# Patient Record
Sex: Female | Born: 1965 | Race: White | Hispanic: Yes | Marital: Married | Smoking: Never smoker
Health system: Southern US, Community
[De-identification: ages and names within clinical notes are randomized; demographics above are authoritative.]

## PROBLEM LIST (undated history)

## (undated) DIAGNOSIS — R928 Other abnormal and inconclusive findings on diagnostic imaging of breast: Secondary | ICD-10-CM

---

## 2014-12-20 ENCOUNTER — Telehealth: Payer: Self-pay | Admitting: *Deleted

## 2014-12-20 ENCOUNTER — Ambulatory Visit: Payer: Self-pay | Attending: Physician Assistant

## 2014-12-20 DIAGNOSIS — M25572 Pain in left ankle and joints of left foot: Secondary | ICD-10-CM

## 2014-12-20 DIAGNOSIS — R262 Difficulty in walking, not elsewhere classified: Secondary | ICD-10-CM

## 2014-12-20 DIAGNOSIS — R609 Edema, unspecified: Secondary | ICD-10-CM

## 2014-12-20 NOTE — Therapy (Signed)
[place], [place], [address] Phone: [phone]   Fax:  [phone]  Physical Therapy Evaluation  Patient Details  Name: Tyianna Menefee MRN: [id number] Date of Birth: 19-Oct-1966  Encounter Date: 12/20/2014      PT End of Session - 12/20/14 0925    Visit Number 1   Number of Visits 12   Date for PT Re-Evaluation 01/29/15   PT Start Time 0845   PT Stop Time 0925   PT Time Calculation (min) 40 min   Activity Tolerance Patient tolerated treatment well   Behavior During Therapy [facility] for tasks assessed/performed      No past medical history on file.  No past surgical history on file.  There were no vitals taken for this visit.  Visit Diagnosis:  Edema  Pain in left ankle  Difficulty walking      Subjective Assessment - 12/20/14 0851    Symptoms Lt ankle pain, medical from anterior to heel cord insertion.    Pertinent History Spouse reports fell 6 months ago and has had pain since.    Limitations Standing;Walking;House hold activities   How long can you sit comfortably? As needed   How long can you stand comfortably? 5-6 hours for work but with pain   How long can you walk comfortably?  5-6 hours for work but with pain   Diagnostic tests Xrays : OA    Patient Stated Goals She wants her foot to heal up to wlk with less pan   Currently in Pain? No/denies   Pain Score 3    Pain Location Ankle   Pain Orientation Left   Pain Type Chronic pain   Pain Onset More than a month ago   Pain Frequency Constant   Aggravating Factors  Walking and standing   Pain Relieving Factors tylenol and rest   Effect of Pain on Daily Activities Limited on feet   Multiple Pain Sites No          [facility] PT Assessment - 12/20/14 0903    Assessment   Medical Diagnosis Lt ankle sprain, OA   Onset Date 05/20/14   Prior Therapy No   Precautions   Precautions None   Restrictions   Weight Bearing Restrictions No    Balance Screen   Has the patient fallen in the past 6 months No   Has the patient had a decrease in activity level because of a fear of falling?  No   Is the patient reluctant to leave their home because of a fear of falling?  No   Prior Function   Level of Independence Independent with basic ADLs   Observation/Other Assessments   Observations Circumference around ankles RT 24 cm  LT 28 cm.    AROM   Right Ankle Dorsiflexion 97   Right Ankle Plantar Flexion 45   Right Ankle Inversion 30   Right Ankle Eversion 22   Left Ankle Dorsiflexion 97   Left Ankle Plantar Flexion 30   Left Ankle Inversion 22   Left Ankle Eversion 32   PROM   Overall PROM  --  Equal to RT passively.    Ambulation/Gait   Ambulation/Gait --  She walks with decr weight to Lt foot. No device                          PT Education - 12/20/14 0925    Education provided Yes   Education  Details ice/elevation , shoe support   Person(s) Educated Patient;Spouse   Methods Explanation;Tactile cues  Done through daughter over phone   Comprehension Verbalized understanding          PT Short Term Goals - 12/20/14 0929    PT SHORT TERM GOAL #1   Title independent with inital HEP   Time 3   Period Weeks   Status New   PT SHORT TERM GOAL #2   Title She will report pain decreased 25% or more with activity on feet   Time 3   Period Weeks   Status New   PT SHORT TERM GOAL #3   Title decrease Edema to 25 Cm   Time 3   Period Weeks   Status New           PT Long Term Goals - 12/20/14 0930    PT LONG TERM GOAL #1   Title independent with HEP issued as of lst visit   Time 6   Period Weeks   Status New   PT LONG TERM GOAL #2   Title She will report 75% decr pain with walking ans standing at work and home.    Time 6   Period Weeks   Status New   PT LONG TERM GOAL #3   Title She will report able to stand and walk out of bed with miunimal pain   Time 6   Period Weeks   Status New                Plan - 12/20/14 4536    Clinical Impression Statement Pain limiting mobility and tolerance on feet.   Pt will benefit from skilled therapeutic intervention in order to improve on the following deficits Difficulty walking;Decreased range of motion;Pain;Increased edema;Decreased endurance   Rehab Potential Good   PT Frequency 2x / week   PT Duration 6 weeks   PT Treatment/Interventions Electrical Stimulation;Ultrasound;Cryotherapy;Manual techniques;Therapeutic exercise;Patient/family education;Passive range of motion;Contrast Bath   PT Next Visit Plan Band exercises , edema control, mobs and STW, taping,    PT Home Exercise Plan iceing , band exer, stretching   Recommended Other Services none   Consulted and Agree with Plan of Care Patient;Family member/caregiver   Family Member Consulted spouse         Problem List There are no active problems to display for this patient.   Darrel Hoover PT 12/20/2014, 10:16 AM  [facility] [address] [place], [place], [address] Phone: [phone]   Fax:  [phone]  Physician: Leonard Downing  Certification Start Date: 94/50/38  Certification End Date: 01/31/15  Physician Documentation Your signature is required to indicate approval of the treatment plan as stated above.  Please sign and either send electronically or make a copy of this report for your files and return this physician signed original.  Please mark one 1.__approve of plan   2. ___approve of plan with the followingconditions. ____________________________________________________________________________________________________________________________________________   ______________________                                                       _____________________ Physician Signature  Date    Faxed to MD for signature

## 2014-12-20 NOTE — Telephone Encounter (Signed)
appts made and printed...td 

## 2014-12-20 NOTE — Patient Instructions (Signed)
They were instructed through their daughter over phone to ice and elevate 2-3x/day as able and to look into new shoes for ankle and arch support.

## 2014-12-27 ENCOUNTER — Ambulatory Visit: Payer: Managed Care, Other (non HMO) | Attending: Physician Assistant | Admitting: Physical Therapy

## 2014-12-27 DIAGNOSIS — R609 Edema, unspecified: Secondary | ICD-10-CM

## 2014-12-27 DIAGNOSIS — M25572 Pain in left ankle and joints of left foot: Secondary | ICD-10-CM

## 2014-12-27 DIAGNOSIS — R262 Difficulty in walking, not elsewhere classified: Secondary | ICD-10-CM | POA: Insufficient documentation

## 2014-12-27 NOTE — Therapy (Signed)
[place], [place], [address] Phone: [phone]   Fax:  [phone]  Physical Therapy Treatment  Patient Details  Name: Morgan Wise MRN: [id number] Date of Birth: 10-13-1966  Encounter Date: 12/27/2014      PT End of Session - 12/27/14 1015    Visit Number 2   Number of Visits 12   Date for PT Re-Evaluation 01/29/15   PT Start Time 0945   PT Stop Time 1020   PT Time Calculation (min) 35 min   Activity Tolerance Patient limited by pain      No past medical history on file.  No past surgical history on file.  There were no vitals taken for this visit.  Visit Diagnosis:  Edema  Pain in left ankle  Difficulty walking      Subjective Assessment - 12/27/14 0953    Symptoms (p) Pain with walking.  3-4/ 10 medial ans at achilles, tender heel to palpation.   Pain Score (p) 4    Pain Location (p) Ankle   Pain Orientation (p) Left                    [facility] Adult PT Treatment/Exercise - 12/27/14 1005    Manual Therapy   Manual Therapy Edema management   friction massage at achilles, tender, taping, kinesiote   Ankle Exercises: Seated   Towel Inversion/Eversion Weights (lbs) --  10 reps band not tolerated   Other Seated Ankle Exercises --  ankle bands, red DF/PF, EV yellow for home                PT Education - 12/27/14 1306    Education provided Yes   Education Details ankle theraband   Person(s) Educated Patient;Child(ren)   Methods Explanation;Demonstration;Handout   Comprehension Verbalized understanding;Returned demonstration          PT Short Term Goals - 12/27/14 1311    PT SHORT TERM GOAL #1   Title independent with inital HEP   Time 3   Period Weeks   Status On-going   PT SHORT TERM GOAL #2   Title She will report pain decreased 25% or more with activity on feet   Time 3   Period Weeks   Status On-going   PT SHORT TERM GOAL #3   Title decrease  Edema to 25 Cm   Time 3   Period Weeks   Status Unable to assess           PT Long Term Goals - 12/20/14 0930    PT LONG TERM GOAL #1   Title independent with HEP issued as of lst visit   Time 6   Period Weeks   Status New   PT LONG TERM GOAL #2   Title She will report 75% decr pain with walking ans standing at work and home.    Time 6   Period Weeks   Status New   PT LONG TERM GOAL #3   Title She will report able to stand and walk out of bed with miunimal pain   Time 6   Period Weeks   Status New               Plan - 12/27/14 1309    Clinical Impression Statement walks with limp, needs better shoe support, able to build her home exercise program.   PT Next Visit Plan Band exercises review, edema control, mobs and STW, taping,  Problem List There are no active problems to display for this patient. Melvenia Needles, PTA 12/27/2014 1:12 PM Phone: [phone] Fax: [phone]   Melvenia Needles 12/27/2014, 1:12 PM  [facility] [address] [place], [place], [address] Phone: [phone]   Fax:  [phone]

## 2014-12-27 NOTE — Patient Instructions (Addendum)
Avoid walking barefoot.

## 2014-12-29 ENCOUNTER — Telehealth: Payer: Self-pay | Admitting: *Deleted

## 2014-12-29 ENCOUNTER — Ambulatory Visit: Payer: Managed Care, Other (non HMO)

## 2014-12-29 DIAGNOSIS — R609 Edema, unspecified: Secondary | ICD-10-CM

## 2014-12-29 DIAGNOSIS — R262 Difficulty in walking, not elsewhere classified: Secondary | ICD-10-CM

## 2014-12-29 DIAGNOSIS — M25572 Pain in left ankle and joints of left foot: Secondary | ICD-10-CM

## 2014-12-29 NOTE — Telephone Encounter (Signed)
APPTS MADE AND PRINTED...TD 

## 2014-12-29 NOTE — Patient Instructions (Addendum)
Answered questions about activity and pain.  Added single leg PF and balance for HEP and ball toss with family for balance. Done daily, 10-15 reps  Alll communication done with interpreter

## 2014-12-29 NOTE — Therapy (Signed)
[place], [place], [address] Phone: [phone]   Fax:  [phone]  Physical Therapy Treatment  Patient Details  Name: Morgan Wise MRN: [id number] Date of Birth: May 13, 1966  Encounter Date: 12/29/2014      PT End of Session - 12/29/14 1136    Visit Number 3   Number of Visits 12   Date for PT Re-Evaluation 01/29/15   PT Start Time 0932   PT Stop Time 1015   PT Time Calculation (min) 43 min   Activity Tolerance Patient tolerated treatment well  no pain post session      No past medical history on file.  No past surgical history on file.  There were no vitals taken for this visit.  Visit Diagnosis:  Edema  Pain in left ankle  Difficulty walking      Subjective Assessment - 12/29/14 0938    Symptoms (p) Lt ankle pain, medical from anterior to heel cord insertion. Better. Went to gym and rode bike without problem.  New sneakers feel better. no pain right now   Currently in Pain? (p) No/denies   Pain Location Ankle   Pain Type Chronic pain   Pain Onset More than a month ago   Aggravating Factors  Walking and standing   Multiple Pain Sites No                    [facility] Adult PT Treatment/Exercise - 12/29/14 0942    Ankle Exercises: Aerobic   Stationary Bike L2 5 minutes   Ankle Exercises: Standing   Rebounder  Rt and left  only able to stand for one toss, worked on pressure to outside of stance foot   Heel Raises 10 reps  LT  cued to not lock knee in to hyperextension   Ankle Exercises: Supine   T-Band red x15 4 way ankle LT                PT Education - 12/29/14 1136    Education provided Yes   Education Details HEP   Person(s) Educated Patient   Methods Explanation;Demonstration;Tactile cues;Verbal cues;Handout   Comprehension Verbalized understanding;Returned demonstration  Interpreter present          PT Short Term Goals - 12/27/14 1311    PT SHORT  TERM GOAL #1   Title independent with inital HEP   Time 3   Period Weeks   Status On-going   PT SHORT TERM GOAL #2   Title She will report pain decreased 25% or more with activity on feet   Time 3   Period Weeks   Status On-going   PT SHORT TERM GOAL #3   Title decrease Edema to 25 Cm   Time 3   Period Weeks   Status Unable to assess           PT Long Term Goals - 12/20/14 0930    PT LONG TERM GOAL #1   Title independent with HEP issued as of lst visit   Time 6   Period Weeks   Status New   PT LONG TERM GOAL #2   Title She will report 75% decr pain with walking ans standing at work and home.    Time 6   Period Weeks   Status New   PT LONG TERM GOAL #3   Title She will report able to stand and walk out of bed with miunimal pain   Time 6  Period Weeks   Status New               Plan - 12/29/14 1138    Clinical Impression Statement She is improved probably due to new foot wear. She is able to do HEP corectly. May need some core strength exercise to help leg posture in long run   Pt will benefit from skilled therapeutic intervention in order to improve on the following deficits Difficulty walking;Decreased range of motion;Pain;Increased edema;Decreased endurance   Rehab Potential Good   PT Frequency 2x / week   PT Duration 4 weeks   PT Treatment/Interventions Electrical Stimulation;Ultrasound;Cryotherapy;Manual techniques;Therapeutic exercise;Patient/family education;Passive range of motion;Contrast Bath   PT Next Visit Plan Band exercises review, edema control, mobs and STW, taping,  Some basic core stretngth   PT Home Exercise Plan iceing , band exer, stretching   Consulted and Agree with Plan of Care Patient;Other (Comment)  interpreter present        Problem List There are no active problems to display for this patient.   Darrel Hoover PT 12/29/2014, 11:40 AM  [facility] [address] [place], [place], [address] Phone: [phone]   Fax:  [phone]

## 2015-01-03 ENCOUNTER — Ambulatory Visit: Payer: Managed Care, Other (non HMO) | Admitting: Rehabilitation

## 2015-01-03 DIAGNOSIS — R262 Difficulty in walking, not elsewhere classified: Secondary | ICD-10-CM

## 2015-01-03 DIAGNOSIS — M25572 Pain in left ankle and joints of left foot: Secondary | ICD-10-CM

## 2015-01-03 DIAGNOSIS — R609 Edema, unspecified: Secondary | ICD-10-CM | POA: Diagnosis not present

## 2015-01-03 NOTE — Therapy (Signed)
[place], [place], [address] Phone: [phone]   Fax:  [phone]  Physical Therapy Treatment  Patient Details  Name: Morgan Wise MRN: [id number] Date of Birth: 1966-12-21 Referring Provider:  Leonard Downing, MD  Encounter Date: 01/03/2015      PT End of Session - 01/03/15 1023    Visit Number 4   Number of Visits 12   Date for PT Re-Evaluation 01/29/15   PT Start Time 0938   PT Stop Time 1023   PT Time Calculation (min) 45 min      No past medical history on file.  No past surgical history on file.  There were no vitals taken for this visit.  Visit Diagnosis:  Edema  Pain in left ankle  Difficulty walking      Subjective Assessment - 01/03/15 0941    Symptoms No pain now, however pain increases to 3-4/10 with job duties intermittently   Currently in Pain? No/denies   Pain Location Ankle  stabing   Pain Orientation Left   Pain Type Chronic pain   Pain Frequency Intermittent   Aggravating Factors  pain increases at night and with job duties intermittenty   Pain Relieving Factors rest   Effect of Pain on Daily Activities limited on feet          [facility] PT Assessment - 01/03/15 0001    AROM   Right Ankle Dorsiflexion 10   Right Ankle Plantar Flexion 50   Right Ankle Inversion 24   Right Ankle Eversion 15                  [facility] Adult PT Treatment/Exercise - 01/03/15 0955    Manual Therapy   Manual Therapy Other (comment)  taping, kinesiotape   Ankle Exercises: Aerobic   Stationary Bike L2 5 minutes   Ankle Exercises: Supine   T-Band red x20 4 way ankle LT   Ankle Exercises: Standing   SLS left few seconds,added to HEP   Heel Raises 20 reps  VC for speed/control   Toe Raise 15 reps  Verbal cues for posture, pt tends to lean posterior                PT Education - 01/03/15 1012    Education provided Yes   Education Details HEP   Person(s) Educated  Patient   Methods Explanation;Handout   Comprehension Verbalized understanding          PT Short Term Goals - 01/03/15 1017    PT SHORT TERM GOAL #1   Title independent with inital HEP   Time 3   Period Weeks   Status Achieved   PT SHORT TERM GOAL #2   Title She will report pain decreased 25% or more with activity on feet   Time 3   Period Weeks   Status Achieved   PT SHORT TERM GOAL #3   Title decrease Edema to 25 Cm   Time 3   Period Weeks   Status Unable to assess           PT Long Term Goals - 01/03/15 1017    PT LONG TERM GOAL #1   Title independent with HEP issued as of lst visit   Time 6   Period Weeks   Status On-going   PT LONG TERM GOAL #2   Title She will report 75% decr pain with walking ans standing at work and home.    Time 6  Period Weeks   Status On-going   PT LONG TERM GOAL #3   Title She will report able to stand and walk out of bed with miunimal pain   Time 6   Period Weeks   Status Achieved               Plan - 01/03/15 0943    Clinical Impression Statement Pt reports some days she can work without pain and have no pain at night, however some days she experiences pain at work and at night when trying to sleep, She reports 50 % decrease in overall pain and she no longer has pain with first getting out of bed to walk   PT Next Visit Plan continue ankle standing ex (review standing HEP) add basic core strength, tape as helpful        Problem List There are no active problems to display for this patient.   Dorene Ar, [place] 01/03/2015, 10:28 AM  [place], [place], [address] Phone: [phone]   Fax:  [phone]

## 2015-01-03 NOTE — Patient Instructions (Signed)
Heel Raise: Bilateral (Standing)   Rise on balls of feet. Repeat _10___ times per set. Do _2___ sets per session. Do __2__ sessions per day.  http://orth.exer.us/38      DORSIFLEXION STRENGTHENING:  Toe Raise (Standing)   Rock back on heels. Repeat __10__ times per set. Do _2___ sets per session. Do __2__ sessions per day.  http://orth.exer.us/42    SINGLE LIMB STANCE   Stance: single leg on floor. Raise leg. Hold _10-30_ seconds. Repeat with other leg. _1-2__ reps per set, _2__ sets per day, _7__ days per week  Copyright  VHI. All rights reserved.

## 2015-01-05 ENCOUNTER — Ambulatory Visit: Payer: Managed Care, Other (non HMO) | Admitting: Rehabilitation

## 2015-01-05 DIAGNOSIS — M25572 Pain in left ankle and joints of left foot: Secondary | ICD-10-CM

## 2015-01-05 DIAGNOSIS — R609 Edema, unspecified: Secondary | ICD-10-CM | POA: Diagnosis not present

## 2015-01-05 DIAGNOSIS — R262 Difficulty in walking, not elsewhere classified: Secondary | ICD-10-CM

## 2015-01-05 NOTE — Therapy (Signed)
[name], [place], [address] Phone: [phone]   Fax:  [phone]  Physical Therapy Treatment  Patient Details  Name: Morgan Wise MRN: [id number] Date of Birth: [birth year] Referring Provider:  Leonard Downing, MD  Encounter Date: 01/05/2015      PT End of Session - 01/05/15 1007    Visit Number 5   Number of Visits 12   Date for PT Re-Evaluation 01/29/15   PT Start Time 0935   PT Stop Time 1016   PT Time Calculation (min) 41 min      No past medical history on file.  No past surgical history on file.  There were no vitals taken for this visit.  Visit Diagnosis:  Edema  Pain in left ankle  Difficulty walking      Subjective Assessment - 01/05/15 0950    Symptoms I have been good. Some intermittent sharp pains in ankle that go away quickly yesterday and today. Performed new HEP 1 time.   Pertinent History Spouse reports fell 6 months ago and has had pain since.           [facility] PT Assessment - 01/05/15 1002    Observation/Other Assessments   Observations 26.5 Circumference left ankle                  [facility] Adult PT Treatment/Exercise - 01/05/15 0957    Lumbar Exercises: Supine   Ab Set 10 reps;5 seconds  cues for technique   Bridge 10 reps  shoulder bridge, cues for technique   Ankle Exercises: Aerobic   Stationary Bike Nustep Level 3 x 5 min   Ankle Exercises: Machines for Strengthening   Cybex Leg Press 1 plate bil x 20, toe press x 20   Ankle Exercises: Standing   SLS 3 sec best without UE support   Rocker Board 2 minutes   Rebounder L SLS with right foot on 6 in step, cues to bear weight mostly through LLE, 20 tosses  fatigue not pain   Heel Raises 20 reps  VC for speed/control   Toe Raise 20 reps   Ankle Exercises: Stretches   Slant Board Stretch 60 seconds  modified using 2 inch step   Ankle Exercises: Supine   T-Band red x20 4 way ankle LT                 PT Education - 01/05/15 1016    Education provided Yes   Education Details Core HEP   Person(s) Educated Patient   Methods Explanation;Handout   Comprehension Verbalized understanding          PT Short Term Goals - 01/05/15 1009    PT SHORT TERM GOAL #1   Title independent with inital HEP   Time 3   Period Weeks   Status Achieved   PT SHORT TERM GOAL #2   Title She will report pain decreased 25% or more with activity on feet   Time 3   Period Weeks   Status Achieved   PT SHORT TERM GOAL #3   Title decrease Edema to 25 Cm   Time 3   Period Weeks   Status On-going           PT Long Term Goals - 01/03/15 1017    PT LONG TERM GOAL #1   Title independent with HEP issued as of lst visit   Time 6   Period Weeks   Status On-going   PT  LONG TERM GOAL #2   Title She will report 75% decr pain with walking ans standing at work and home.    Time 6   Period Weeks   Status On-going   PT LONG TERM GOAL #3   Title She will report able to stand and walk out of bed with miunimal pain   Time 6   Period Weeks   Status Achieved               Plan - 01/05/15 1003    Clinical Impression Statement Edema reduced, able to tolerate closed chain therex in clinic without increased pain   PT Next Visit Plan Review Core, continue closed chain        Problem List There are no active problems to display for this patient.   Dorene Ar, [place] 01/05/2015, 10:21 AM  [facility] [address] [place], [place], [address] Phone: [phone]   Fax:  [phone]

## 2015-01-05 NOTE — Patient Instructions (Signed)
   PELVIC TILT  Lie on back, legs bent. Exhale, tilting top of pelvis back, pubic bone up, to flatten lower back. Inhale, rolling pelvis opposite way, top forward, pubic bone down, arch in back. Repeat __10__ times. Do __2__ sessions per day. Copyright  VHI. All rights reserved.    Isometric Hold With Pelvic Floor (Hook-Lying)  Lie with hips and knees bent. Slowly inhale, and then exhale. Pull navel toward spine and tighten pelvic floor. Hold for __10_ seconds. Continue to breathe in and out during hold. Rest for _10__ seconds. Repeat __10_ times. Do __2-3_ times a day.    Bridge Toys ''R'' UsPose   Press small of back into mat, maintain pelvic tilt, roll up one vertebrae at a time. Focus on engaging posterior hip muscles. Hold for __1__ breaths. Repeat _10___ times.

## 2015-01-10 ENCOUNTER — Ambulatory Visit: Payer: Managed Care, Other (non HMO)

## 2015-01-10 DIAGNOSIS — R609 Edema, unspecified: Secondary | ICD-10-CM

## 2015-01-10 DIAGNOSIS — R262 Difficulty in walking, not elsewhere classified: Secondary | ICD-10-CM

## 2015-01-10 DIAGNOSIS — M25572 Pain in left ankle and joints of left foot: Secondary | ICD-10-CM

## 2015-01-10 NOTE — Therapy (Addendum)
[place], [place], [address] Phone: [phone]   Fax:  [phone]  Physical Therapy Treatment  Patient Details  Name: Morgan Wise MRN: [id number] Date of Birth: [birth year] Referring Provider:  Leonard Downing, MD  Encounter Date: 01/10/2015      PT End of Session - 01/10/15 1454    Visit Number 6   Number of Visits 12   Date for PT Re-Evaluation 01/29/15   PT Start Time 3532   PT Stop Time 1555   PT Time Calculation (min) 38 min   Activity Tolerance Patient tolerated treatment well   Behavior During Therapy [facility] for tasks assessed/performed      No past medical history on file.  No past surgical history on file.  There were no vitals taken for this visit.  Visit Diagnosis:  Edema  Pain in left ankle  Difficulty walking      Subjective Assessment - 01/10/15 1423    Symptoms No pain today.   She had pain walking yesterday. Her pain level was 3/10. she had been walking 2 hours.    How long can you sit comfortably? .    Currently in Pain? No/denies   Pain Type Chronic pain   Pain Onset More than a month ago   Pain Frequency Intermittent   Aggravating Factors  Pain nwith walking long periods   Pain Relieving Factors rest   Multiple Pain Sites No                    [facility] Adult PT Treatment/Exercise - 01/10/15 1426    Lumbar Exercises: Supine   Ab Set 10 reps;5 seconds   AB Set Limitations also di 5 reps with ball squeeze 5 sec   Bridge 10 reps   Bridge Limitations Also di 5 reps with ball squeeze 5 sec   Other Supine Lumbar Exercises posterior pelvic tilt x10 5 sec   Other Supine Lumbar Exercises Part sit up with ball squeeze x10   Ankle Exercises: Aerobic   Stationary Bike Nustep Level 4 x 5 min   Ankle Exercises: Standing   Vector Stance Right  LT leg stance x12 forward/back and sideway to RT   Rebounder L SLS with right foot on 6 in step, cues to bear weight mostly  through LLE, 20 tosses   Heel Raises 20 reps   Toe Raise 20 reps   Other Standing Ankle Exercises Core and LE strith wall slides xgth                   PT Short Term Goals - 01/05/15 1009    PT SHORT TERM GOAL #1   Title independent with inital HEP   Time 3   Period Weeks   Status Achieved   PT SHORT TERM GOAL #2   Title She will report pain decreased 25% or more with activity on feet   Time 3   Period Weeks   Status Achieved   PT SHORT TERM GOAL #3   Title decrease Edema to 25 Cm   Time 3   Period Weeks   Status On-going           PT Long Term Goals - 01/03/15 1017    PT LONG TERM GOAL #1   Title independent with HEP issued as of lst visit   Time 6   Period Weeks   Status On-going   PT LONG TERM GOAL #2   Title She will  report 75% decr pain with walking ans standing at work and home.    Time 6   Period Weeks   Status On-going   PT LONG TERM GOAL #3   Title She will report able to stand and walk out of bed with miunimal pain   Time 6   Period Weeks   Status Achieved               Plan - 01/10/15 1455    Clinical Impression Statement Much improved with tolerance on feet.  Will add core exercise to program and advance as able to have moe prximal stability to aid wih ankle stength.    Pt will benefit from skilled therapeutic intervention in order to improve on the following deficits Difficulty walking;Decreased range of motion;Pain;Increased edema;Decreased endurance   Rehab Potential Good   PT Frequency 2x / week   PT Duration 3 weeks   PT Treatment/Interventions Electrical Stimulation;Ultrasound;Cryotherapy;Manual techniques;Therapeutic exercise;Patient/family education;Passive range of motion;Contrast Bath   PT Next Visit Plan Review Core, continue closed chain   PT Home Exercise Plan iceing , band exer, stretching   Consulted and Agree with Plan of Care Patient;Family member/caregiver        Problem List There are no active problems to  display for this patient.   Darrel Hoover PT 01/10/2015, 2:59 PM  [facility] [address] [place], [place], [address] Phone: [phone]   Fax:  [phone]     PHYSICAL THERAPY DISCHARGE SUMMARY  Visits from Start of Care: 6  Current functional level related to goals / functional outcomes: See abovve   Remaining deficits: Unknown as she did not return after this visit   Education / Equipment: HEP Plan:                                                    Patient goals were not met. Patient is being discharged due to not returning since the last visit.  ?????    Darrel Hoover, PT    08/30/15      4:43 PM

## 2015-01-13 ENCOUNTER — Ambulatory Visit: Payer: Managed Care, Other (non HMO) | Admitting: Rehabilitation

## 2015-01-13 DIAGNOSIS — R609 Edema, unspecified: Secondary | ICD-10-CM

## 2015-01-13 DIAGNOSIS — R262 Difficulty in walking, not elsewhere classified: Secondary | ICD-10-CM

## 2015-01-13 DIAGNOSIS — M25572 Pain in left ankle and joints of left foot: Secondary | ICD-10-CM

## 2015-01-13 NOTE — Therapy (Addendum)
[place], [place], [address] Phone: [phone]   Fax:  [phone]  Physical Therapy Treatment  Patient Details  Name: Morgan Wise MRN: [id number] Date of Birth: 1966/10/27 Referring Provider:  Leonard Downing, MD  Encounter Date: 01/13/2015      PT End of Session - 01/13/15 1024    Visit Number 7   Number of Visits 12   Date for PT Re-Evaluation 01/29/15   PT Start Time 0935   PT Stop Time 1013   PT Time Calculation (min) 38 min      No past medical history on file.  No past surgical history on file.  There were no vitals taken for this visit.  Visit Diagnosis:  Edema  Pain in left ankle  Difficulty walking      Subjective Assessment - 01/13/15 0938    Symptoms No pain today. She reports intermittent 4-5/10 "poking" pains with gait. It does not happen everytime she walks and it makes no difference if it is a short or long distance. The pain comes and goes quickly.    How long can you sit comfortably? no limit   How long can you stand comfortably? no limit   How long can you walk comfortably? it is always different   Diagnostic tests Xrays : OA    Patient Stated Goals She wants her foot to heal up to wlk with less pan   Currently in Pain? No/denies          [facility] PT Assessment - 01/13/15 1012    Observation/Other Assessments   Observations 25 circumference around ankle   Strength   Left Ankle Plantar Flexion 4/5  able to perform > 10 heel raises                  [facility] Adult PT Treatment/Exercise - 01/13/15 0948    Lumbar Exercises: Supine   Ab Set 10 reps   Bent Knee Raise 10 reps   Bridge 10 reps  with ball squeeze   Ankle Exercises: Aerobic   Stationary Bike Rec Bike L2 x 8 min   Ankle Exercises: Standing   BAPS Level 2;15 reps  circles with tactile and verbal cues   Rebounder L SLS 8 toss best    Heel Raises 20 reps  bilateral, then single leg, cues for using  great toes   Other Standing Ankle Exercises heel raises on step x 15   Ankle Exercises: Stretches   Other Stretch prostretch 1 min                  PT Short Term Goals - 01/13/15 1010    PT SHORT TERM GOAL #1   Title independent with inital HEP   Time 3   Period Weeks   Status Achieved   PT SHORT TERM GOAL #2   Title She will report pain decreased 25% or more with activity on feet   Time 3   Period Weeks   Status Achieved   PT SHORT TERM GOAL #3   Title decrease Edema to 25 Cm   Time 3   Period Weeks   Status Not Met  26 cm           PT Long Term Goals - 01/13/15 1010    PT LONG TERM GOAL #1   Title independent with HEP issued as of lst visit   Time 6   Period Weeks   Status Achieved   PT  LONG TERM GOAL #2   Title She will report 75% decr pain with walking ans standing at work and home.    Time 6   Period Weeks   Status Achieved   PT LONG TERM GOAL #3   Title She will report able to stand and walk out of bed with miunimal pain   Time 6   Period Weeks   Status Achieved               Plan - 01/13/15 1015    Clinical Impression Statement Pt reports 75% decrease in pain. All Goals met except edema goal. She has 2 CM greater edema left vs right ankle circumference.    PT Next Visit Plan discharge today probable. Pt plans to F/U with MD next week        Problem List There are no active problems to display for this patient.   Hessie Diener [place], [place] 01/13/2015, 10:24 AM  [facility] [address] [place], [place], [address] Phone: [phone]   Fax:  [phone]                                                                                               PHYSICAL THERAPY DISCHARGE SUMMARY  Visits from Start of Care: 7  Current functional level related to goals / functional outcomes: 75% improve with activity on feet. Pain varies per day but improved   Remaining deficits: See goals  above   Education / Equipment: HEP Plan: Patient agrees to discharge.  Patient goals were met. Patient is being discharged due to being pleased with the current functional level.  ?????   Darrel Hoover, PT                                                                                                                                 01/25/15  12:27 PM

## 2019-05-04 ENCOUNTER — Ambulatory Visit: Payer: Managed Care, Other (non HMO) | Attending: Nurse Practitioner | Admitting: Nurse Practitioner

## 2019-05-04 ENCOUNTER — Encounter: Payer: Self-pay | Admitting: Nurse Practitioner

## 2019-05-04 DIAGNOSIS — Z0001 Encounter for general adult medical examination with abnormal findings: Secondary | ICD-10-CM

## 2019-05-04 DIAGNOSIS — Z1211 Encounter for screening for malignant neoplasm of colon: Secondary | ICD-10-CM

## 2019-05-04 DIAGNOSIS — M25572 Pain in left ankle and joints of left foot: Secondary | ICD-10-CM

## 2019-05-04 DIAGNOSIS — R2242 Localized swelling, mass and lump, left lower limb: Secondary | ICD-10-CM

## 2019-05-04 DIAGNOSIS — Z0000 Encounter for general adult medical examination without abnormal findings: Secondary | ICD-10-CM

## 2019-05-04 NOTE — Progress Notes (Signed)
Virtual Visit via Telephone Note Due to national recommendations of social distancing due to [place] 19, telehealth visit is felt to be most appropriate for this patient at this time.  I discussed the limitations, risks, security and privacy concerns of performing an evaluation and management service by telephone and the availability of in person appointments. I also discussed with the patient that there may be a patient responsible charge related to this service. The patient expressed understanding and agreed to proceed.    I connected with Morgan Wise on 05/04/19  at 2:30 PM EDT  EDT by telephone and verified that I am speaking with the correct person using two identifiers.   Consent I discussed the limitations, risks, security and privacy concerns of performing an evaluation and management service by telephone and the availability of in person appointments. I also discussed with the patient that there may be a patient responsible charge related to this service. The patient expressed understanding and agreed to proceed.   Location of Patient: Private residence   Location of Provider: [place] participating in Telemedicine visit: Geryl Rankins FNP-BC [place]  ID# [id number]   History of Present Illness: Telemedicine visit for: Establish care  She has been here in [place] for 8 years after moving here from. [place]. She has not seen a primary care provider since before moving to [place]. She has not had a PAP smear in over 8 years. She has never had a mammogram. She will be scheduled for fasting lab work.  Denies chest pain, shortness of breath, palpitations, lightheadedness, dizziness, headaches or BLE edema.    History reviewed. No pertinent past medical history.  Past Surgical History:  Procedure Laterality Date  . CESAREAN SECTION     3x    Family History  Problem Relation Age of Onset  . Diabetes  Father   . Diabetes Sister     Social History   Socioeconomic History  . Marital status: Married    Spouse name: Not on file  . Number of children: Not on file  . Years of education: Not on file  . Highest education level: Not on file  Occupational History  . Not on file  Social Needs  . Financial resource strain: Not on file  . Food insecurity:    Worry: Not on file    Inability: Not on file  . Transportation needs:    Medical: Not on file    Non-medical: Not on file  Tobacco Use  . Smoking status: Never Smoker  . Smokeless tobacco: Never Used  Substance and Sexual Activity  . Alcohol use: Not Currently  . Drug use: Not Currently  . Sexual activity: Not Currently    Birth control/protection: None  Lifestyle  . Physical activity:    Days per week: Not on file    Minutes per session: Not on file  . Stress: Not on file  Relationships  . Social connections:    Talks on phone: Not on file    Gets together: Not on file    Attends religious service: Not on file    Active member of club or organization: Not on file    Attends meetings of clubs or organizations: Not on file    Relationship status: Not on file  Other Topics Concern  . Not on file  Social History Narrative  . Not on file     Observations/Objective: Awake, alert and oriented x  3   Review of Systems  Constitutional: Negative for fever, malaise/fatigue and weight loss.  HENT: Negative.  Negative for nosebleeds.   Eyes: Negative.  Negative for blurred vision, double vision and photophobia.  Respiratory: Negative.  Negative for cough and shortness of breath.   Cardiovascular: Negative.  Negative for chest pain, palpitations and leg swelling.  Gastrointestinal: Negative.  Negative for heartburn, nausea and vomiting.  Musculoskeletal: Positive for joint pain (left ankle sprain 2016. She had physical therapy.  Endorses intermittent swelling in ankle at times as well as some joint pain. No symptoms currently).  Negative for myalgias.  Neurological: Negative.  Negative for dizziness, focal weakness, seizures and headaches.  Psychiatric/Behavioral: Negative.  Negative for suicidal ideas.    Assessment and Plan: Glendoris was seen today for new patient (initial visit).  Diagnoses and all orders for this visit:  Routine adult health maintenance -     CBC; Future -     CMP14+EGFR; Future -     Lipid panel; Future -     TSH; Future Referred to breast clinic for mammogram.   Colon cancer screening -     Fecal occult blood, imunochemical([facility]); Future    Follow Up Instructions Return in about 2 months (around 07/04/2019) for Fasting labs.     I discussed the assessment and treatment plan with the patient. The patient was provided an opportunity to ask questions and all were answered. The patient agreed with the plan and demonstrated an understanding of the instructions.   The patient was advised to call back or seek an in-person evaluation if the symptoms worsen or if the condition fails to improve as anticipated.  I provided 23 minutes of non-face-to-face time during this encounter including median intraservice time, reviewing previous notes, labs, imaging, medications and explaining diagnosis and management.  Gildardo Pounds, FNP-BC

## 2019-05-05 ENCOUNTER — Other Ambulatory Visit (HOSPITAL_COMMUNITY): Payer: Self-pay | Admitting: *Deleted

## 2019-05-05 DIAGNOSIS — Z1231 Encounter for screening mammogram for malignant neoplasm of breast: Secondary | ICD-10-CM

## 2019-05-06 ENCOUNTER — Other Ambulatory Visit: Payer: Self-pay

## 2019-05-06 ENCOUNTER — Ambulatory Visit: Payer: Managed Care, Other (non HMO) | Attending: Nurse Practitioner

## 2019-05-06 DIAGNOSIS — Z0000 Encounter for general adult medical examination without abnormal findings: Secondary | ICD-10-CM

## 2019-05-06 DIAGNOSIS — Z1211 Encounter for screening for malignant neoplasm of colon: Secondary | ICD-10-CM

## 2019-05-07 LAB — CBC
Hematocrit: 40.6 % (ref 34.0–46.6)
Hemoglobin: 13.7 g/dL (ref 11.1–15.9)
MCH: 27.5 pg (ref 26.6–33.0)
MCHC: 33.7 g/dL (ref 31.5–35.7)
MCV: 81 fL (ref 79–97)
Platelets: 395 10*3/uL (ref 150–450)
RBC: 4.99 x10E6/uL (ref 3.77–5.28)
RDW: 13 % (ref 11.7–15.4)
WBC: 9.6 10*3/uL (ref 3.4–10.8)

## 2019-05-07 LAB — LIPID PANEL
Chol/HDL Ratio: 4.9 ratio — ABNORMAL HIGH (ref 0.0–4.4)
Cholesterol, Total: 194 mg/dL (ref 100–199)
HDL: 40 mg/dL (ref >39)
LDL Calculated: 113 mg/dL — ABNORMAL HIGH (ref 0–99)
Triglycerides: 205 mg/dL — ABNORMAL HIGH (ref 0–149)
VLDL Cholesterol Cal: 41 mg/dL — ABNORMAL HIGH (ref 5–40)

## 2019-05-07 LAB — CMP14+EGFR
ALT: 17 IU/L (ref 0–32)
AST: 16 IU/L (ref 0–40)
Albumin/Globulin Ratio: 1.3 (ref 1.2–2.2)
Albumin: 3.9 g/dL (ref 3.8–4.9)
Alkaline Phosphatase: 87 IU/L (ref 39–117)
BUN/Creatinine Ratio: 18 (ref 9–23)
BUN: 10 mg/dL (ref 6–24)
Bilirubin Total: 0.2 mg/dL (ref 0.0–1.2)
CO2: 22 mmol/L (ref 20–29)
Calcium: 9.3 mg/dL (ref 8.7–10.2)
Chloride: 103 mmol/L (ref 96–106)
Creatinine, Ser: 0.57 mg/dL (ref 0.57–1.00)
GFR calc Af Amer: 124 mL/min/{1.73_m2} (ref >59)
GFR calc non Af Amer: 108 mL/min/{1.73_m2} (ref >59)
Globulin, Total: 3 g/dL (ref 1.5–4.5)
Glucose: 129 mg/dL — ABNORMAL HIGH (ref 65–99)
Potassium: 4.1 mmol/L (ref 3.5–5.2)
Sodium: 140 mmol/L (ref 134–144)
Total Protein: 6.9 g/dL (ref 6.0–8.5)

## 2019-05-07 LAB — TSH: TSH: 5.09 u[IU]/mL — ABNORMAL HIGH (ref 0.450–4.500)

## 2019-05-12 ENCOUNTER — Ambulatory Visit (HOSPITAL_COMMUNITY)
Admission: RE | Admit: 2019-05-12 | Discharge: 2019-05-12 | Disposition: A | Discharge status: Home / Self Care | Payer: Managed Care, Other (non HMO) | Source: Ambulatory Visit | Attending: Obstetrics and Gynecology | Admitting: Obstetrics and Gynecology

## 2019-05-12 ENCOUNTER — Other Ambulatory Visit: Payer: Self-pay

## 2019-05-12 ENCOUNTER — Encounter (HOSPITAL_COMMUNITY): Payer: Self-pay

## 2019-05-12 VITALS — BP 132/86 | Temp 98.0°F | Wt 245.0 lb

## 2019-05-12 DIAGNOSIS — Z01419 Encounter for gynecological examination (general) (routine) without abnormal findings: Secondary | ICD-10-CM

## 2019-05-12 NOTE — Patient Instructions (Addendum)
Explained breast self awareness with Vaughan Basta. Let patient know [facility] will cover Pap smears and HPV typing every 5 years unless has a history of abnormal Pap smears. Referred patient to the [facility] for a screening mammogram. Appointment scheduled for Thursday, May 21, 2019 at 0940. Patient aware of appointment and will be there. Let patient know will follow up with her within the next couple weeks with results of Pap smear by letter or phone. Informed patient that the Breast Center will follow-up with her within a couple of weeks following appointment with results of mammogram by letter or phone. Kenli Winning verbalized understanding.  Winnie Barsky, Kathaleen Maser, RN 10:07 AM

## 2019-05-12 NOTE — Progress Notes (Signed)
No complaints today.   Pap Smear: Pap smear completed today. Last Pap smear was 8-10 years ago in [facility] and normal per patient. Per patient has no history of an abnormal Pap smear. No Pap smear results are in Epic.  Physical exam: Breasts Breasts symmetrical. No skin abnormalities bilateral breasts. No nipple retraction bilateral breasts. No nipple discharge bilateral breasts. No lymphadenopathy. No lumps palpated bilateral breasts. No complaints of pain or tenderness on exam. Referred patient to the [facility] for a screening mammogram. Appointment scheduled for Thursday, May 21, 2019 at 0940.        Pelvic/Bimanual   Ext Genitalia No lesions, no swelling and no discharge observed on external genitalia.         Vagina Vagina pink and normal texture. No lesions or discharge observed in vagina.          Cervix Cervix is present. Cervix pink and of normal texture. No discharge observed.     Uterus Uterus is present and palpable. Uterus in normal position and normal size.        Adnexae Bilateral ovaries present and palpable. No tenderness on palpation.         Rectovaginal No rectal exam completed today since patient had no rectal complaints. No skin abnormalities observed on exam.    Smoking History: Patient has never smoked.  Patient Navigation: Patient education provided. Access to services provided for patient through [facility] program. Spanish interpreter provided.   Colorectal Cancer Screening: Per patient has never had a colonoscopy completed. No complaints today.   Breast and Cervical Cancer Risk Assessment: Patient has no family history of breast cancer, known genetic mutations, or radiation treatment to the chest before age 84. Patient has no history of cervical dysplasia, immunocompromised, or DES exposure in-utero.  Risk Assessment    Risk Scores      05/12/2019   Last edited by: Lynnell Dike, LPN   5-year risk: 0.7 %   Lifetime risk: 6.3  %         Used Spanish interpreter Natale Lay from [place].

## 2019-05-13 ENCOUNTER — Encounter (HOSPITAL_COMMUNITY): Payer: Self-pay | Admitting: *Deleted

## 2019-05-13 LAB — CYTOLOGY - PAP
Diagnosis: NEGATIVE
HPV: NOT DETECTED

## 2019-05-17 LAB — FECAL OCCULT BLOOD, IMMUNOCHEMICAL: Fecal Occult Bld: NEGATIVE

## 2019-05-19 ENCOUNTER — Telehealth (HOSPITAL_COMMUNITY): Payer: Self-pay

## 2019-05-19 NOTE — Telephone Encounter (Signed)
Spoke with patient via interpreter Natale Lay. Let her know that her pap was (negative and the hpv -) and that she will be due for her next pap in 5 years. Patient voiced understanding.

## 2019-05-21 ENCOUNTER — Ambulatory Visit
Admission: RE | Admit: 2019-05-21 | Discharge: 2019-05-21 | Disposition: A | Discharge status: Home / Self Care | Payer: Managed Care, Other (non HMO) | Source: Ambulatory Visit | Attending: Obstetrics and Gynecology | Admitting: Obstetrics and Gynecology

## 2019-05-21 ENCOUNTER — Other Ambulatory Visit: Payer: Self-pay

## 2019-05-21 DIAGNOSIS — Z1231 Encounter for screening mammogram for malignant neoplasm of breast: Secondary | ICD-10-CM

## 2019-05-22 ENCOUNTER — Other Ambulatory Visit: Payer: Self-pay | Admitting: Obstetrics and Gynecology

## 2019-05-22 DIAGNOSIS — R928 Other abnormal and inconclusive findings on diagnostic imaging of breast: Secondary | ICD-10-CM

## 2019-05-27 ENCOUNTER — Other Ambulatory Visit: Payer: Self-pay

## 2019-05-27 ENCOUNTER — Other Ambulatory Visit: Payer: Self-pay | Admitting: Obstetrics and Gynecology

## 2019-05-27 ENCOUNTER — Ambulatory Visit
Admission: RE | Admit: 2019-05-27 | Discharge: 2019-05-27 | Disposition: A | Discharge status: Home / Self Care | Payer: No Typology Code available for payment source | Source: Ambulatory Visit | Attending: Obstetrics and Gynecology | Admitting: Obstetrics and Gynecology

## 2019-05-27 DIAGNOSIS — R928 Other abnormal and inconclusive findings on diagnostic imaging of breast: Secondary | ICD-10-CM

## 2019-06-01 ENCOUNTER — Other Ambulatory Visit: Payer: Self-pay

## 2019-06-01 ENCOUNTER — Ambulatory Visit
Admission: RE | Admit: 2019-06-01 | Discharge: 2019-06-01 | Disposition: A | Discharge status: Home / Self Care | Payer: No Typology Code available for payment source | Source: Ambulatory Visit | Attending: Obstetrics and Gynecology | Admitting: Obstetrics and Gynecology

## 2019-06-01 DIAGNOSIS — R928 Other abnormal and inconclusive findings on diagnostic imaging of breast: Secondary | ICD-10-CM

## 2019-06-08 ENCOUNTER — Other Ambulatory Visit: Payer: Self-pay | Admitting: General Surgery

## 2019-06-08 DIAGNOSIS — R928 Other abnormal and inconclusive findings on diagnostic imaging of breast: Secondary | ICD-10-CM

## 2019-06-11 ENCOUNTER — Encounter (HOSPITAL_COMMUNITY): Payer: Self-pay

## 2019-06-17 ENCOUNTER — Other Ambulatory Visit: Payer: Self-pay | Admitting: General Surgery

## 2019-06-17 DIAGNOSIS — R928 Other abnormal and inconclusive findings on diagnostic imaging of breast: Secondary | ICD-10-CM

## 2019-06-22 ENCOUNTER — Encounter (HOSPITAL_COMMUNITY): Payer: Self-pay | Admitting: *Deleted

## 2019-06-22 NOTE — Progress Notes (Signed)
Letter mailed to patient with negative pap smear results. HPV was negative. Next pap smear due in five years. 

## 2019-06-30 ENCOUNTER — Other Ambulatory Visit (HOSPITAL_COMMUNITY): Payer: Managed Care, Other (non HMO)

## 2019-07-06 ENCOUNTER — Ambulatory Visit: Payer: Managed Care, Other (non HMO) | Admitting: Nurse Practitioner

## 2019-07-10 ENCOUNTER — Other Ambulatory Visit: Payer: Self-pay

## 2019-07-10 ENCOUNTER — Encounter (HOSPITAL_BASED_OUTPATIENT_CLINIC_OR_DEPARTMENT_OTHER): Payer: Self-pay | Admitting: *Deleted

## 2019-07-13 ENCOUNTER — Other Ambulatory Visit (HOSPITAL_COMMUNITY)
Admission: RE | Admit: 2019-07-13 | Discharge: 2019-07-13 | Disposition: A | Discharge status: Home / Self Care | Payer: HRSA Program | Source: Ambulatory Visit | Attending: General Surgery | Admitting: General Surgery

## 2019-07-13 DIAGNOSIS — Z1159 Encounter for screening for other viral diseases: Secondary | ICD-10-CM | POA: Insufficient documentation

## 2019-07-14 ENCOUNTER — Other Ambulatory Visit (HOSPITAL_COMMUNITY): Payer: No Typology Code available for payment source

## 2019-07-14 ENCOUNTER — Other Ambulatory Visit (HOSPITAL_COMMUNITY): Payer: Managed Care, Other (non HMO)

## 2019-07-14 LAB — SARS CORONAVIRUS 2 (TAT 6-24 HRS): SARS Coronavirus 2: NEGATIVE

## 2019-07-16 ENCOUNTER — Other Ambulatory Visit: Payer: Self-pay

## 2019-07-16 ENCOUNTER — Ambulatory Visit
Admission: RE | Admit: 2019-07-16 | Discharge: 2019-07-16 | Disposition: A | Discharge status: Home / Self Care | Payer: Managed Care, Other (non HMO) | Source: Ambulatory Visit | Attending: General Surgery | Admitting: General Surgery

## 2019-07-16 DIAGNOSIS — R928 Other abnormal and inconclusive findings on diagnostic imaging of breast: Secondary | ICD-10-CM

## 2019-07-16 NOTE — H&P (Signed)
Vaughan BastaAmalia Depaolo Location: [facility] Patient #: [id number] DOB: 02/05/1966 Undefined / Language: [place] / Race: White Female   History of Present Illness  The patient is a 53 year old female who presents with a complaint of Breast problems. Pt is a 53 yo F referred for consultation by Dr. Judyann MunsonArceo for abnormal mammogram of left breast. She was seen to have distortion that did not go away with compression. Core needle biopsy was performed and showed complex sclerosing lesion which is discordant.   She has never had a breast biopsy before. She has no family cancer history. She has several children with the oldest at age 51.    Dx mammogram left 05/27/2019 DIGITAL DIAGNOSTIC LEFT MAMMOGRAM WITH CAD AND TOMO  ULTRASOUND LEFT BREAST  COMPARISON: Previous exam(s).  ACR Breast Density Category b: There are scattered areas of fibroglandular density.  FINDINGS: Persistent asymmetry with associated distortion within the upper slightly outer left breast middle depth, further evaluated with CC and MLO spot tomosynthesis images.  Mammographic images were processed with CAD.  Targeted ultrasound is performed, showing no left axillary adenopathy. No definite discrete correlate for asymmetry and distortion within the left breast.  IMPRESSION: Indeterminate focal asymmetry with associated distortion left breast.  RECOMMENDATION: Stereotactic guided core needle biopsy left breast asymmetry and distortion.  I have discussed the findings and recommendations with the patient. Results were also provided in writing at the conclusion of the visit. If applicable, a reminder letter will be sent to the patient regarding the next appointment.  BI-RADS CATEGORY 4: Suspicious.  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 Cesarean  Section - Multiple   Diagnostic Studies History Colonoscopy  never Mammogram  within last year Pap Smear  1-5 years ago  Allergies No Known Drug Allergies  [06/08/2019]: Allergies Reconciled   Medication History  No Current Medications Medications Reconciled  Social History Caffeine use  Carbonated beverages, Coffee, Tea. No alcohol use  No drug use  Tobacco use  Never smoker.  Family History Diabetes Mellitus  Brother, Father. Hypertension  Family Members In General. Seizure disorder  Son.  Pregnancy / Birth History Age at menarche  17 years. Age of menopause  7046-52 Gravida  3 Length (months) of breastfeeding  3-6 Maternal age  51-30 Para  3  Other Problems Lump In Breast     Review of Systems  General Not Present- Appetite Loss, Chills, Fatigue, Fever, Night Sweats, Weight Gain and Weight Loss. Skin Not Present- Change in Wart/Mole, Dryness, Hives, Jaundice, New Lesions, Non-Healing Wounds, Rash and Ulcer. HEENT Not Present- Earache, Hearing Loss, Hoarseness, Nose Bleed, Oral Ulcers, Ringing in the Ears, Seasonal Allergies, Sinus Pain, Sore Throat, Visual Disturbances, Wears glasses/contact lenses and Yellow Eyes. Respiratory Not Present- Bloody sputum, Chronic Cough, Difficulty Breathing, Snoring and Wheezing. Breast Present- Breast Mass. Not Present- Breast Pain, Nipple Discharge and Skin Changes. Cardiovascular Not Present- Chest Pain, Difficulty Breathing Lying Down, Leg Cramps, Palpitations, Rapid Heart Rate, Shortness of Breath and Swelling of Extremities. Gastrointestinal Not Present- Abdominal Pain, Bloating, Bloody Stool, Change in Bowel Habits, Chronic diarrhea, Constipation, Difficulty Swallowing, Excessive gas, Gets full quickly at meals, Hemorrhoids, Indigestion, Nausea, Rectal Pain and Vomiting. Female Genitourinary Not Present- Frequency, Nocturia, Painful Urination, Pelvic Pain and Urgency. Neurological Not Present- Decreased  Memory, Fainting, Headaches, Numbness, Seizures, Tingling, Tremor, Trouble walking and Weakness. Endocrine Present- Hot flashes. Not  Present- Cold Intolerance, Excessive Hunger, Hair Changes, Heat Intolerance and New Diabetes. Hematology Not Present- Blood Thinners, Easy Bruising, Excessive bleeding, Gland problems, HIV and Persistent Infections.  Vitals  Weight: 240.5 lb Height: 61in Body Surface Area: 2.04 m Body Mass Index: 45.44 kg/m  Temp.: 98.8F (Oral)  Pulse: 82 (Regular)  BP: 124/72(Sitting, Left Arm, Standard)    Physical Exam  General Mental Status-Alert. General Appearance-Consistent with stated age. Hydration-Well hydrated. Voice-Normal.  Head and Neck Head-normocephalic, atraumatic with no lesions or palpable masses. Trachea-midline. Thyroid Gland Characteristics - normal size and consistency.  Eye Eyeball - Bilateral-Extraocular movements intact. Sclera/Conjunctiva - Bilateral-No scleral icterus.  Chest and Lung Exam Chest and lung exam reveals -quiet, even and easy respiratory effort with no use of accessory muscles and on auscultation, normal breath sounds, no adventitious sounds and normal vocal resonance. Inspection Chest Wall - Normal. Back - normal.  Breast Note: no palpable masses. no nipple retraction or nipple discharge. breasts relatively symmetric. left breast with faint bruising. No LAD.   Cardiovascular Cardiovascular examination reveals -normal heart sounds, regular rate and rhythm with no murmurs and normal pedal pulses bilaterally.  Abdomen Inspection Inspection of the abdomen reveals - No Hernias. Palpation/Percussion Palpation and Percussion of the abdomen reveal - Soft, Non Tender, No Rebound tenderness, No Rigidity (guarding) and No hepatosplenomegaly. Auscultation Auscultation of the abdomen reveals - Bowel sounds normal.  Neurologic Neurologic evaluation reveals -alert and oriented x 3 with no  impairment of recent or remote memory. Mental Status-Normal.  Musculoskeletal Global Assessment -Note: no gross deformities.  Normal Exam - Left-Upper Extremity Strength Normal and Lower Extremity Strength Normal. Normal Exam - Right-Upper Extremity Strength Normal and Lower Extremity Strength Normal.  Lymphatic Head & Neck  General Head & Neck Lymphatics: Bilateral - Description - Normal. Axillary  General Axillary Region: Bilateral - Description - Normal. Tenderness - Non Tender. Femoral & Inguinal  Generalized Femoral & Inguinal Lymphatics: Bilateral - Description - No Generalized lymphadenopathy.    Assessment & Plan  ABNORMAL MAMMOGRAM OF LEFT BREAST (R92.8) Impression: Pt will need seed localized excisional biopsy. Discussed rationale for this.  The surgical procedure was described to the patient. I discussed the incision type and location and that we would need radiology involved on with a wire or seed marker and/or sentinel node.  The risks and benefits of the procedure were described to the patient and she wishes to proceed.  We discussed the risks bleeding, infection, damage to other structures, need for further procedures/surgeries. We discussed the risk of seroma. The patient was advised if the area in the breast in cancer, we may need to go back to surgery for additional tissue to obtain negative margins or for a lymph node biopsy. The patient was advised that these are the most common complications, but that others can occur as well. They were advised against taking aspirin or other anti-inflammatory agents/blood thinners the week before surgery.  Visit was performed with interpreter.  Current Plans You are being scheduled for surgery- Our schedulers will call you.  You should hear from our office's scheduling department within 5 working days about the location, date, and time of surgery. We try to make accommodations for patient's preferences in  scheduling surgery, but sometimes the OR schedule or the surgeon's schedule prevents us from making those accommodations.  If you have not heard from our office [phone]) in 5 working days, call the office and ask for your surgeon's nurse.  If you have other questions about your diagnosis,  plan, or surgery, call the office and ask for your surgeon's nurse.  Pt Education - [facility] Breast Biopsy HCI: discussed with patient and provided information.   Signed by Stark Klein, MD

## 2019-07-17 ENCOUNTER — Ambulatory Visit
Admission: RE | Admit: 2019-07-17 | Discharge: 2019-07-17 | Disposition: A | Discharge status: Home / Self Care | Payer: No Typology Code available for payment source | Source: Ambulatory Visit | Attending: General Surgery | Admitting: General Surgery

## 2019-07-17 ENCOUNTER — Other Ambulatory Visit: Payer: Self-pay

## 2019-07-17 ENCOUNTER — Ambulatory Visit (HOSPITAL_BASED_OUTPATIENT_CLINIC_OR_DEPARTMENT_OTHER): Payer: Self-pay | Admitting: Certified Registered"

## 2019-07-17 ENCOUNTER — Encounter (HOSPITAL_BASED_OUTPATIENT_CLINIC_OR_DEPARTMENT_OTHER): Payer: Self-pay

## 2019-07-17 ENCOUNTER — Encounter (HOSPITAL_BASED_OUTPATIENT_CLINIC_OR_DEPARTMENT_OTHER)
Admission: RE | Disposition: A | Discharge status: Home / Self Care | Payer: Self-pay | Source: Ambulatory Visit | Attending: General Surgery

## 2019-07-17 ENCOUNTER — Ambulatory Visit (HOSPITAL_BASED_OUTPATIENT_CLINIC_OR_DEPARTMENT_OTHER)
Admission: RE | Admit: 2019-07-17 | Discharge: 2019-07-17 | Disposition: A | Discharge status: Home / Self Care | Payer: Self-pay | Source: Ambulatory Visit | Attending: General Surgery | Admitting: General Surgery

## 2019-07-17 DIAGNOSIS — R928 Other abnormal and inconclusive findings on diagnostic imaging of breast: Secondary | ICD-10-CM

## 2019-07-17 DIAGNOSIS — Z6841 Body Mass Index (BMI) 40.0 and over, adult: Secondary | ICD-10-CM | POA: Insufficient documentation

## 2019-07-17 DIAGNOSIS — N6489 Other specified disorders of breast: Secondary | ICD-10-CM | POA: Insufficient documentation

## 2019-07-17 DIAGNOSIS — N62 Hypertrophy of breast: Secondary | ICD-10-CM | POA: Insufficient documentation

## 2019-07-17 DIAGNOSIS — N6012 Diffuse cystic mastopathy of left breast: Secondary | ICD-10-CM | POA: Insufficient documentation

## 2019-07-17 HISTORY — DX: Other abnormal and inconclusive findings on diagnostic imaging of breast: R92.8

## 2019-07-17 HISTORY — PX: RADIOACTIVE SEED GUIDED EXCISIONAL BREAST BIOPSY: SHX6490

## 2019-07-17 SURGERY — RADIOACTIVE SEED GUIDED BREAST BIOPSY
Anesthesia: General | Site: Breast | Laterality: Left

## 2019-07-17 MED ORDER — DEXAMETHASONE SODIUM PHOSPHATE 4 MG/ML IJ SOLN
INTRAMUSCULAR | Status: DC | PRN
  Administered 2019-07-17: 4 mg via INTRAVENOUS

## 2019-07-17 MED ORDER — ACETAMINOPHEN 500 MG PO TABS
1000.0000 mg | ORAL_TABLET | ORAL | Status: AC
  Administered 2019-07-17: 1000 mg via ORAL

## 2019-07-17 MED ORDER — LIDOCAINE HCL (PF) 1 % IJ SOLN
INTRAMUSCULAR | Status: DC | PRN
  Administered 2019-07-17: 15 mL

## 2019-07-17 MED ORDER — CHLORHEXIDINE GLUCONATE CLOTH 2 % EX PADS: 6.0000 | MEDICATED_PAD | Freq: Once | CUTANEOUS | Status: DC

## 2019-07-17 MED ORDER — LIDOCAINE HCL (CARDIAC) PF 100 MG/5ML IV SOSY
PREFILLED_SYRINGE | INTRAVENOUS | Status: DC | PRN
  Administered 2019-07-17: 60 mg via INTRAVENOUS

## 2019-07-17 MED ORDER — CEFAZOLIN SODIUM-DEXTROSE 2-4 GM/100ML-% IV SOLN
INTRAVENOUS | Status: AC
  Filled 2019-07-17: qty 100

## 2019-07-17 MED ORDER — ONDANSETRON HCL 4 MG/2ML IJ SOLN
INTRAMUSCULAR | Status: DC | PRN
  Administered 2019-07-17: 4 mg via INTRAVENOUS

## 2019-07-17 MED ORDER — MIDAZOLAM HCL 2 MG/2ML IJ SOLN
INTRAMUSCULAR | Status: AC
  Filled 2019-07-17: qty 2

## 2019-07-17 MED ORDER — ACETAMINOPHEN 500 MG PO TABS
ORAL_TABLET | ORAL | Status: AC
  Filled 2019-07-17: qty 2

## 2019-07-17 MED ORDER — LIDOCAINE HCL (PF) 1 % IJ SOLN
INTRAMUSCULAR | Status: AC
  Filled 2019-07-17: qty 30

## 2019-07-17 MED ORDER — PROPOFOL 10 MG/ML IV BOLUS
INTRAVENOUS | Status: DC | PRN
  Administered 2019-07-17: 150 mg via INTRAVENOUS

## 2019-07-17 MED ORDER — OXYCODONE HCL 5 MG PO TABS: 5.0000 mg | ORAL_TABLET | Freq: Once | ORAL | Status: DC | PRN

## 2019-07-17 MED ORDER — ONDANSETRON HCL 4 MG/2ML IJ SOLN
INTRAMUSCULAR | Status: AC
  Filled 2019-07-17: qty 16

## 2019-07-17 MED ORDER — PROPOFOL 500 MG/50ML IV EMUL
INTRAVENOUS | Status: DC | PRN
  Administered 2019-07-17: 25 ug/kg/min via INTRAVENOUS

## 2019-07-17 MED ORDER — MIDAZOLAM HCL 2 MG/2ML IJ SOLN
1.0000 mg | INTRAMUSCULAR | Status: DC | PRN
  Administered 2019-07-17: 2 mg via INTRAVENOUS

## 2019-07-17 MED ORDER — FENTANYL CITRATE (PF) 100 MCG/2ML IJ SOLN
50.0000 ug | INTRAMUSCULAR | Status: DC | PRN
  Administered 2019-07-17 (×2): 50 ug via INTRAVENOUS

## 2019-07-17 MED ORDER — OXYCODONE HCL 5 MG/5ML PO SOLN: 5.0000 mg | Freq: Once | ORAL | Status: DC | PRN

## 2019-07-17 MED ORDER — PROMETHAZINE HCL 25 MG/ML IJ SOLN: 6.2500 mg | INTRAMUSCULAR | Status: DC | PRN

## 2019-07-17 MED ORDER — SCOPOLAMINE 1 MG/3DAYS TD PT72: 1.0000 | MEDICATED_PATCH | Freq: Once | TRANSDERMAL | Status: DC

## 2019-07-17 MED ORDER — FENTANYL CITRATE (PF) 100 MCG/2ML IJ SOLN
INTRAMUSCULAR | Status: AC
  Filled 2019-07-17: qty 2

## 2019-07-17 MED ORDER — BUPIVACAINE-EPINEPHRINE (PF) 0.5% -1:200000 IJ SOLN
INTRAMUSCULAR | Status: AC
  Filled 2019-07-17: qty 30

## 2019-07-17 MED ORDER — ROCURONIUM BROMIDE 10 MG/ML (PF) SYRINGE
PREFILLED_SYRINGE | INTRAVENOUS | Status: AC
  Filled 2019-07-17: qty 20

## 2019-07-17 MED ORDER — GABAPENTIN 300 MG PO CAPS
ORAL_CAPSULE | ORAL | Status: AC
  Filled 2019-07-17: qty 1

## 2019-07-17 MED ORDER — LIDOCAINE 2% (20 MG/ML) 5 ML SYRINGE
INTRAMUSCULAR | Status: AC
  Filled 2019-07-17: qty 20

## 2019-07-17 MED ORDER — GABAPENTIN 300 MG PO CAPS
300.0000 mg | ORAL_CAPSULE | ORAL | Status: AC
  Administered 2019-07-17: 300 mg via ORAL

## 2019-07-17 MED ORDER — OXYCODONE HCL 5 MG PO TABS: 5.0000 mg | ORAL_TABLET | Freq: Four times a day (QID) | ORAL | 0 refills | Status: AC | PRN

## 2019-07-17 MED ORDER — BUPIVACAINE-EPINEPHRINE 0.5% -1:200000 IJ SOLN
INTRAMUSCULAR | Status: DC | PRN
  Administered 2019-07-17: 15 mL

## 2019-07-17 MED ORDER — FENTANYL CITRATE (PF) 100 MCG/2ML IJ SOLN
25.0000 ug | INTRAMUSCULAR | Status: DC | PRN
Start: 2019-07-17 — End: 2019-07-17

## 2019-07-17 MED ORDER — KETOROLAC TROMETHAMINE 30 MG/ML IJ SOLN: 30.0000 mg | Freq: Once | INTRAMUSCULAR | Status: DC | PRN

## 2019-07-17 MED ORDER — CEFAZOLIN SODIUM-DEXTROSE 2-4 GM/100ML-% IV SOLN
2.0000 g | INTRAVENOUS | Status: AC
  Administered 2019-07-17: 2 g via INTRAVENOUS

## 2019-07-17 MED ORDER — DEXAMETHASONE SODIUM PHOSPHATE 10 MG/ML IJ SOLN
INTRAMUSCULAR | Status: AC
  Filled 2019-07-17: qty 4

## 2019-07-17 MED ORDER — LACTATED RINGERS IV SOLN
INTRAVENOUS | Status: DC
  Administered 2019-07-17: 07:00:00 via INTRAVENOUS

## 2019-07-17 MED ORDER — 0.9 % SODIUM CHLORIDE (POUR BTL) OPTIME
TOPICAL | Status: DC | PRN
  Administered 2019-07-17: 1000 mL

## 2019-07-17 SURGICAL SUPPLY — 55 items
BINDER BREAST 3XL (GAUZE/BANDAGES/DRESSINGS) ×2 IMPLANT
BLADE SURG 10 STRL SS (BLADE) ×3 IMPLANT
BLADE SURG 15 STRL LF DISP TIS (BLADE) IMPLANT
BLADE SURG 15 STRL SS (BLADE)
CANISTER SUC SOCK COL 7IN (MISCELLANEOUS) IMPLANT
CANISTER SUCT 1200ML W/VALVE (MISCELLANEOUS) ×3 IMPLANT
CHLORAPREP W/TINT 26 (MISCELLANEOUS) ×3 IMPLANT
CLIP VESOCCLUDE LG 6/CT (CLIP) ×3 IMPLANT
CLOSURE WOUND 1/2 X4 (GAUZE/BANDAGES/DRESSINGS) ×1
COVER BACK TABLE REUSABLE LG (DRAPES) ×3 IMPLANT
COVER MAYO STAND REUSABLE (DRAPES) ×3 IMPLANT
COVER PROBE W GEL 5X96 (DRAPES) ×3 IMPLANT
COVER WAND RF STERILE (DRAPES) IMPLANT
DECANTER SPIKE VIAL GLASS SM (MISCELLANEOUS) ×4 IMPLANT
DERMABOND ADVANCED (GAUZE/BANDAGES/DRESSINGS) ×2
DERMABOND ADVANCED .7 DNX12 (GAUZE/BANDAGES/DRESSINGS) ×1 IMPLANT
DRAPE LAPAROSCOPIC ABDOMINAL (DRAPES) ×3 IMPLANT
DRAPE UTILITY XL STRL (DRAPES) ×3 IMPLANT
ELECT BLADE 4.0 EZ CLEAN MEGAD (MISCELLANEOUS) ×3
ELECT COATED BLADE 2.86 ST (ELECTRODE) ×3 IMPLANT
ELECT REM PT RETURN 9FT ADLT (ELECTROSURGICAL) ×3
ELECTRODE BLDE 4.0 EZ CLN MEGD (MISCELLANEOUS) IMPLANT
ELECTRODE REM PT RTRN 9FT ADLT (ELECTROSURGICAL) ×1 IMPLANT
GAUZE SPONGE 4X4 12PLY STRL LF (GAUZE/BANDAGES/DRESSINGS) ×3 IMPLANT
GLOVE BIO SURGEON STRL SZ 6 (GLOVE) ×3 IMPLANT
GLOVE BIOGEL PI IND STRL 6.5 (GLOVE) ×1 IMPLANT
GLOVE BIOGEL PI INDICATOR 6.5 (GLOVE) ×2
GOWN STRL REUS W/ TWL LRG LVL3 (GOWN DISPOSABLE) ×1 IMPLANT
GOWN STRL REUS W/TWL 2XL LVL3 (GOWN DISPOSABLE) ×3 IMPLANT
GOWN STRL REUS W/TWL LRG LVL3 (GOWN DISPOSABLE) ×2
KIT MARKER MARGIN INK (KITS) ×3 IMPLANT
LIGHT WAVEGUIDE WIDE FLAT (MISCELLANEOUS) ×2 IMPLANT
NDL HYPO 25X1 1.5 SAFETY (NEEDLE) ×1 IMPLANT
NEEDLE HYPO 25X1 1.5 SAFETY (NEEDLE) ×3 IMPLANT
NS IRRIG 1000ML POUR BTL (IV SOLUTION) ×3 IMPLANT
PACK BASIN DAY SURGERY FS (CUSTOM PROCEDURE TRAY) ×3 IMPLANT
PENCIL BUTTON HOLSTER BLD 10FT (ELECTRODE) ×3 IMPLANT
SLEEVE SCD COMPRESS KNEE MED (MISCELLANEOUS) ×3 IMPLANT
SPECIMEN JAR LARGE (MISCELLANEOUS) ×4 IMPLANT
SPONGE LAP 18X18 RF (DISPOSABLE) ×3 IMPLANT
STAPLER VISISTAT 35W (STAPLE) IMPLANT
STRIP CLOSURE SKIN 1/2X4 (GAUZE/BANDAGES/DRESSINGS) ×2 IMPLANT
SUT MON AB 4-0 PC3 18 (SUTURE) ×3 IMPLANT
SUT SILK 2 0 SH (SUTURE) IMPLANT
SUT VIC AB 2-0 SH 18 (SUTURE) IMPLANT
SUT VIC AB 3-0 SH 27 (SUTURE)
SUT VIC AB 3-0 SH 27X BRD (SUTURE) ×1 IMPLANT
SUT VICRYL 3-0 CR8 SH (SUTURE) ×2 IMPLANT
SYR BULB 3OZ (MISCELLANEOUS) ×3 IMPLANT
SYR CONTROL 10ML LL (SYRINGE) ×3 IMPLANT
TOWEL GREEN STERILE FF (TOWEL DISPOSABLE) ×3 IMPLANT
TRAY FAXITRON CT DISP (TRAY / TRAY PROCEDURE) ×5 IMPLANT
TUBE CONNECTING 20'X1/4 (TUBING) ×1
TUBE CONNECTING 20X1/4 (TUBING) ×2 IMPLANT
YANKAUER SUCT BULB TIP NO VENT (SUCTIONS) ×3 IMPLANT

## 2019-07-17 NOTE — Discharge Instructions (Addendum)
[facility] °Office Phone Number [phone] ° °BREAST BIOPSY/ PARTIAL MASTECTOMY: POST OP INSTRUCTIONS ° °Always review your discharge instruction sheet given to you by the facility where your surgery was performed. ° °IF YOU HAVE DISABILITY OR FAMILY LEAVE FORMS, YOU MUST BRING THEM TO THE OFFICE FOR PROCESSING.  DO NOT GIVE THEM TO YOUR DOCTOR. ° °1. A prescription for pain medication may be given to you upon discharge.  Take your pain medication as prescribed, if needed.  If narcotic pain medicine is not needed, then you may take acetaminophen (Tylenol) or ibuprofen (Advil) as needed. °2. Take your usually prescribed medications unless otherwise directed °3. If you need a refill on your pain medication, please contact your pharmacy.  They will contact our office to request authorization.  Prescriptions will not be filled after 5pm or on week-ends. °4. You should eat very light the first 24 hours after surgery, such as soup, crackers, pudding, etc.  Resume your normal diet the day after surgery. °5. Most patients will experience some swelling and bruising in the breast.  Ice packs and a good support bra will help.  Swelling and bruising can take several days to resolve.  °6. It is common to experience some constipation if taking pain medication after surgery.  Increasing fluid intake and taking a stool softener will usually help or prevent this problem from occurring.  A mild laxative (Milk of Magnesia or Miralax) should be taken according to package directions if there are no bowel movements after 48 hours. °7. Unless discharge instructions indicate otherwise, you may remove your bandages 48 hours after surgery, and you may shower at that time.  You may have steri-strips (small skin tapes) in place directly over the incision.  These strips should be left on the skin for 7-10 days.   Any sutures or staples will be removed at the office during your follow-up visit. °8. ACTIVITIES:  You may resume  regular daily activities (gradually increasing) beginning the next day.  Wearing a good support bra or sports bra (or the breast binder) minimizes pain and swelling.  You may have sexual intercourse when it is comfortable. °a. You may drive when you no longer are taking prescription pain medication, you can comfortably wear a seatbelt, and you can safely maneuver your car and apply brakes. °b. RETURN TO WORK:  __________1 week_______________ °9. You should see your doctor in the office for a follow-up appointment approximately two weeks after your surgery.  Your doctor’s nurse will typically make your follow-up appointment when she calls you with your pathology report.  Expect your pathology report 2-3 business days after your surgery.  You may call to check if you do not hear from us after three days. ° ° °WHEN TO CALL YOUR DOCTOR: °1. Fever over 101.0 °2. Nausea and/or vomiting. °3. Extreme swelling or bruising. °4. Continued bleeding from incision. °5. Increased pain, redness, or drainage from the incision. ° °The clinic staff is available to answer your questions during regular business hours.  Please don’t hesitate to call and ask to speak to one of the nurses for clinical concerns.  If you have a medical emergency, go to the nearest emergency room or call 911.  A surgeon from [facility] is always on call at the hospital. ° °For further questions, please visit [email or web address]  ° ° °Post Anesthesia Home Care Instructions ° °Activity: °Get plenty of rest for the remainder of the day. A responsible individual must stay with you for 24   hours following the procedure.  °For the next 24 hours, DO NOT: °-Drive a car °-Operate machinery °-Drink alcoholic beverages °-Take any medication unless instructed by your physician °-Make any legal decisions or sign important papers. ° °Meals: °Start with liquid foods such as gelatin or soup. Progress to regular foods as tolerated. Avoid greasy, spicy,  heavy foods. If nausea and/or vomiting occur, drink only clear liquids until the nausea and/or vomiting subsides. Call your physician if vomiting continues. ° °Special Instructions/Symptoms: °Your throat may feel dry or sore from the anesthesia or the breathing tube placed in your throat during surgery. If this causes discomfort, gargle with warm salt water. The discomfort should disappear within 24 hours. ° °If you had a scopolamine patch placed behind your ear for the management of post- operative nausea and/or vomiting: ° °1. The medication in the patch is effective for 72 hours, after which it should be removed.  Wrap patch in a tissue and discard in the trash. Wash hands thoroughly with soap and water. °2. You may remove the patch earlier than 72 hours if you experience unpleasant side effects which may include dry mouth, dizziness or visual disturbances. °3. Avoid touching the patch. Wash your hands with soap and water after contact with the patch. °  ° °

## 2019-07-17 NOTE — Anesthesia Procedure Notes (Signed)
Procedure Name: LMA Insertion Date/Time: 07/17/2019 7:46 AM Performed by: Signe Colt, CRNA Pre-anesthesia Checklist: Patient identified, Emergency Drugs available, Suction available and Patient being monitored Patient Re-evaluated:Patient Re-evaluated prior to induction Oxygen Delivery Method: Circle system utilized Preoxygenation: Pre-oxygenation with 100% oxygen Induction Type: IV induction Ventilation: Mask ventilation without difficulty LMA: LMA inserted LMA Size: 4.0 Number of attempts: 1 Airway Equipment and Method: Bite block Placement Confirmation: positive ETCO2 Tube secured with: Tape Dental Injury: Teeth and Oropharynx as per pre-operative assessment

## 2019-07-17 NOTE — Anesthesia Postprocedure Evaluation (Signed)
Anesthesia Post Note  Patient: Morgan Wise  Procedure(s) Performed: RADIOACTIVE SEED GUIDED LEFT BREAST EXCISIONAL BIOPSY (Left Breast)     Patient location during evaluation: PACU Anesthesia Type: General Level of consciousness: awake and alert Pain management: pain level controlled Vital Signs Assessment: post-procedure vital signs reviewed and stable Respiratory status: spontaneous breathing, nonlabored ventilation, respiratory function stable and patient connected to nasal cannula oxygen Cardiovascular status: blood pressure returned to baseline and stable Postop Assessment: no apparent nausea or vomiting Anesthetic complications: no    Last Vitals:  Vitals:   07/17/19 0915 07/17/19 0930  BP: (!) 144/82 (!) 153/78  Pulse: 87 83  Resp: (!) 21 20  Temp:    SpO2: 100% 100%    Last Pain:  Vitals:   07/17/19 0930  TempSrc:   PainSc: 2                  Tajah Schreiner S

## 2019-07-17 NOTE — Interval H&P Note (Signed)
History and Physical Interval Note:  07/17/2019 7:25 AM  Morgan Wise  has presented today for surgery, with the diagnosis of LEFT ABNORMAL MAMMOGRAM.  The various methods of treatment have been discussed with the patient and family. After consideration of risks, benefits and other options for treatment, the patient has consented to  Procedure(s): RADIOACTIVE SEED GUIDED LEFT BREAST EXCISIONAL BIOPSY (Left) as a surgical intervention.  The patient's history has been reviewed, patient examined, no change in status, stable for surgery.  I have reviewed the patient's chart and labs.  Questions were answered to the patient's satisfaction.     Stark Klein

## 2019-07-17 NOTE — Op Note (Signed)
Left Breast Radioactive seed localized excisional biopsy  Indications: This patient presents with history of abnormal left mammogram and discordant core needle biopsy  Pre-operative Diagnosis: left abnormal mammogram  Post-operative Diagnosis: Same  Surgeon: Stark Klein   Anesthesia: General endotracheal anesthesia  ASA Class: 2  Procedure Details  The patient was seen in the Holding Room. The risks, benefits, complications, treatment options, and expected outcomes were discussed with the patient. The possibilities of bleeding, infection, the need for additional procedures, failure to diagnose a condition, and creating a complication requiring transfusion or operation were discussed with the patient. The patient concurred with the proposed plan, giving informed consent.  The site of surgery properly noted/marked. The patient was taken to Operating Room # 8, identified, and the procedure verified as Left Breast seed localized excisional biopsy. A Time Out was held and the above information confirmed.  The left breast and chest were prepped and draped in standard fashion. The lumpectomy was performed by creating a curvilinear transverse incision over the upper outer quadrant of the breast over the previously placed radioactive seed.  Dissection was carried down to around the point of maximum signal intensity and around 2 cm lower as the seed was a bit superior to the clip. The cautery was used to perform the dissection.  Hemostasis was achieved with cautery.   The specimen was inked with the margin marker paint kit.    Specimen radiography confirmed inclusion of the seed.  An additional inferior margin was taken with the cautery, marked, and radiographed.  The seed was present in this specimen.  The background signal in the breast was zero.  Local anesthetic was infiltrated into the wound and surrounding tissues.  The wound was irrigated and closed with 3-0 vicryl in layers and 4-0 monocryl  subcuticular suture.      Sterile dressings were applied. At the end of the operation, all sponge, instrument, and needle counts were correct.  Findings: grossly clear surgical margins  Estimated Blood Loss:  min         Specimens: left breast tissue with seed         Complications:  None; patient tolerated the procedure well.         Disposition: PACU - hemodynamically stable.         Condition: stable

## 2019-07-17 NOTE — Anesthesia Preprocedure Evaluation (Signed)
Anesthesia Evaluation  Patient identified by MRN, date of birth, ID band Patient awake    Reviewed: Allergy & Precautions, NPO status , Patient's Chart, lab work & pertinent test results  Airway Mallampati: II  TM Distance: >3 FB Neck ROM: Full    Dental no notable dental hx.    Pulmonary neg pulmonary ROS,    Pulmonary exam normal breath sounds clear to auscultation       Cardiovascular negative cardio ROS Normal cardiovascular exam Rhythm:Regular Rate:Normal     Neuro/Psych negative neurological ROS  negative psych ROS   GI/Hepatic negative GI ROS, Neg liver ROS,   Endo/Other  Morbid obesity  Renal/GU negative Renal ROS  negative genitourinary   Musculoskeletal negative musculoskeletal ROS (+)   Abdominal   Peds negative pediatric ROS (+)  Hematology negative hematology ROS (+)   Anesthesia Other Findings   Reproductive/Obstetrics negative OB ROS                             Anesthesia Physical Anesthesia Plan  ASA: II  Anesthesia Plan: General   Post-op Pain Management:    Induction: Intravenous  PONV Risk Score and Plan: 3 and Ondansetron, Dexamethasone, Midazolam and Treatment may vary due to age or medical condition  Airway Management Planned: LMA  Additional Equipment:   Intra-op Plan:   Post-operative Plan: Extubation in OR  Informed Consent: I have reviewed the patients History and Physical, chart, labs and discussed the procedure including the risks, benefits and alternatives for the proposed anesthesia with the patient or authorized representative who has indicated his/her understanding and acceptance.     Dental advisory given  Plan Discussed with: CRNA and Surgeon  Anesthesia Plan Comments:        Anesthesia Quick Evaluation  

## 2019-07-17 NOTE — Transfer of Care (Signed)
Immediate Anesthesia Transfer of Care Note  Patient: Morgan Wise  Procedure(s) Performed: RADIOACTIVE SEED GUIDED LEFT BREAST EXCISIONAL BIOPSY (Left Breast)  Patient Location: PACU  Anesthesia Type:General  Level of Consciousness: drowsy and patient cooperative  Airway & Oxygen Therapy: Patient Spontanous Breathing and Patient connected to nasal cannula oxygen  Post-op Assessment: Report given to RN and Post -op Vital signs reviewed and stable  Post vital signs: Reviewed and stable  Last Vitals:  Vitals Value Taken Time  BP    Temp    Pulse 80 07/17/19 0850  Resp 18 07/17/19 0850  SpO2 98 % 07/17/19 0850  Vitals shown include unvalidated device data.  Last Pain:  Vitals:   07/17/19 0707  TempSrc: Oral  PainSc: 0-No pain         Complications: No apparent anesthesia complications

## 2019-07-20 ENCOUNTER — Encounter (HOSPITAL_BASED_OUTPATIENT_CLINIC_OR_DEPARTMENT_OTHER): Payer: Self-pay | Admitting: General Surgery

## 2019-07-20 ENCOUNTER — Ambulatory Visit: Payer: Managed Care, Other (non HMO) | Admitting: Nurse Practitioner

## 2019-07-20 NOTE — Progress Notes (Signed)
Please let patient know pathology is benign.

## 2020-10-09 IMAGING — MG DIGITAL SCREENING BILATERAL MAMMOGRAM WITH TOMO AND CAD
8 series · 8 of 24 positions shown · non-contrast
Comparison: None.

CLINICAL DATA: Screening.

EXAM:
DIGITAL SCREENING BILATERAL MAMMOGRAM WITH TOMO AND CAD

[L CC synth-2D]
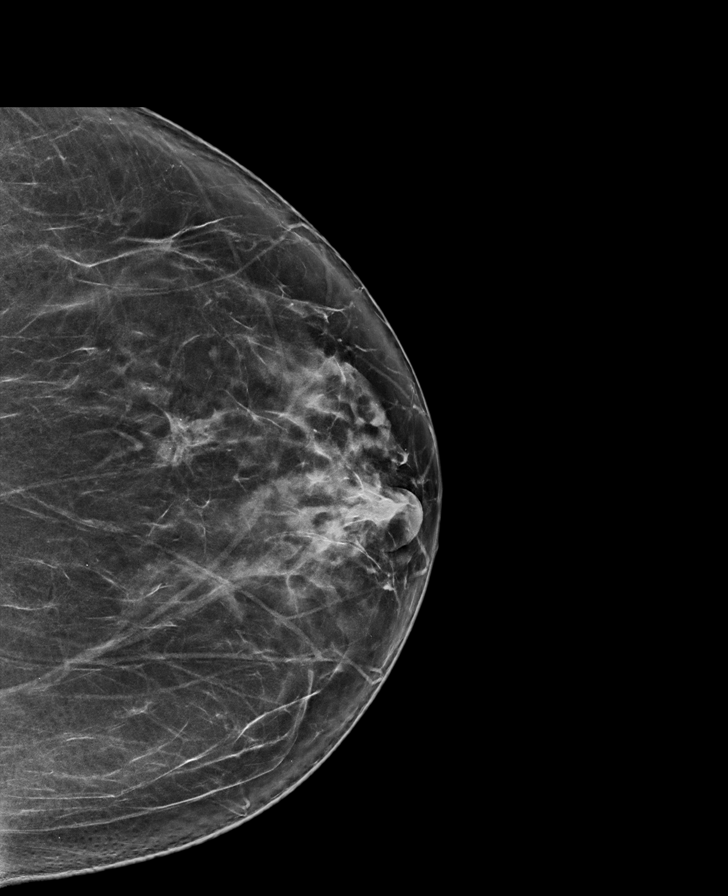

[L MLO synth-2D]
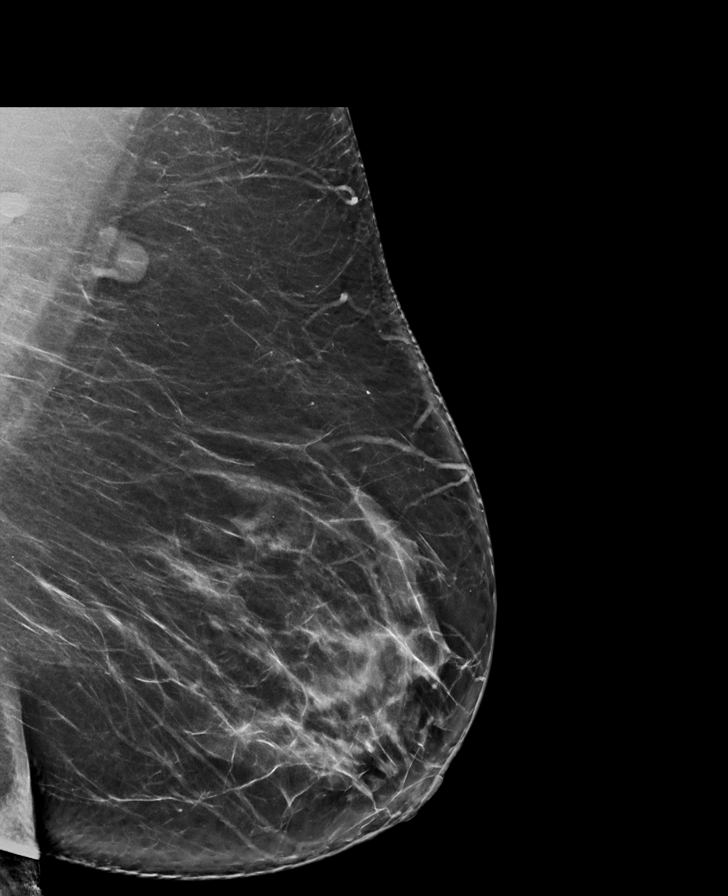

[R MLO synth-2D]
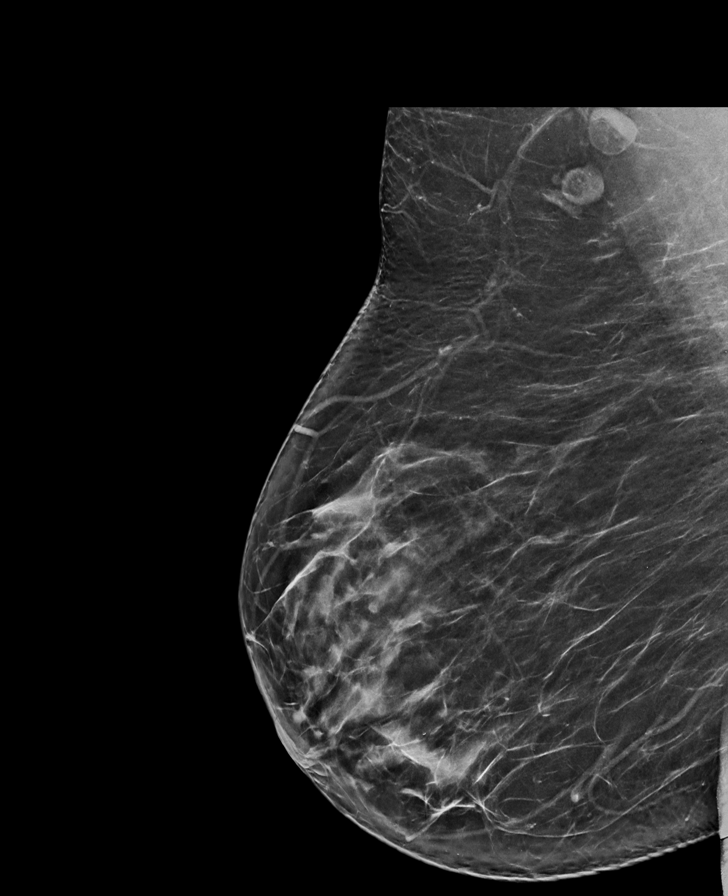

[R CC synth-2D]
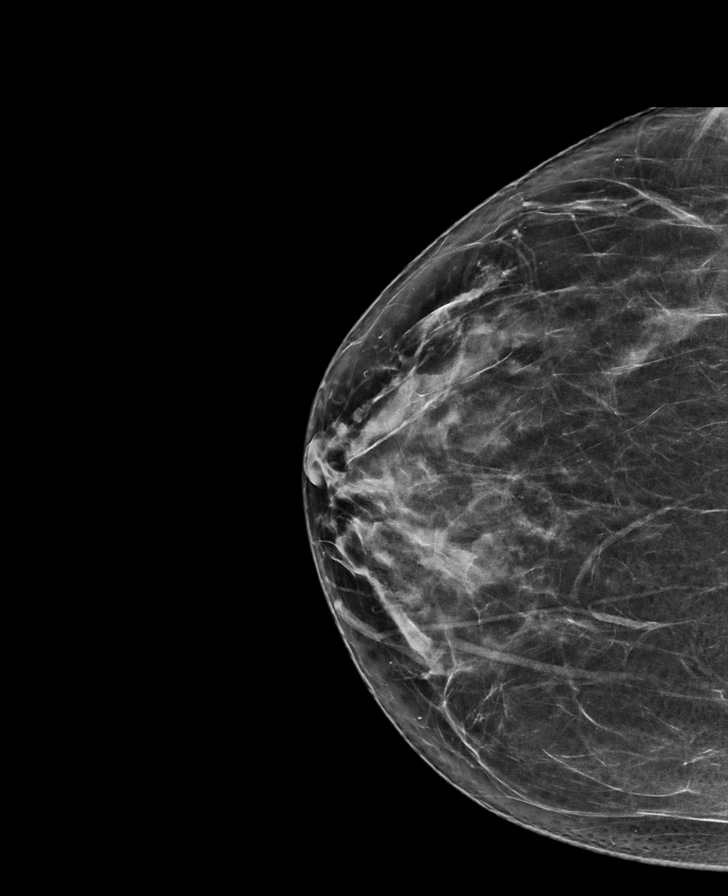

[L MLO tomo · tomo slice 47/92.0]
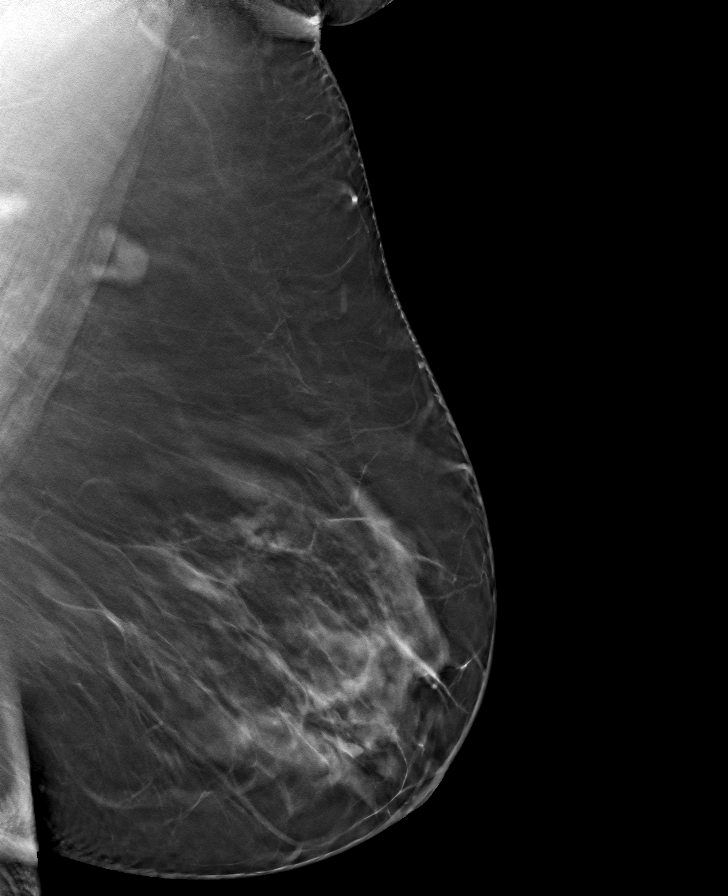

[L CC tomo · tomo slice 41/81.0]
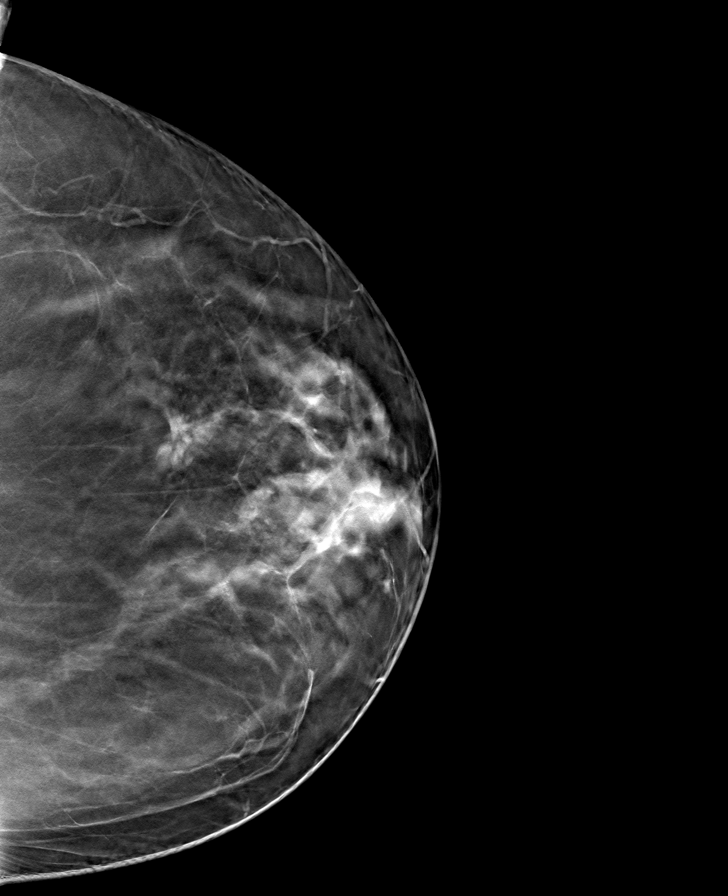

[R MLO tomo · tomo slice 44/87.0]
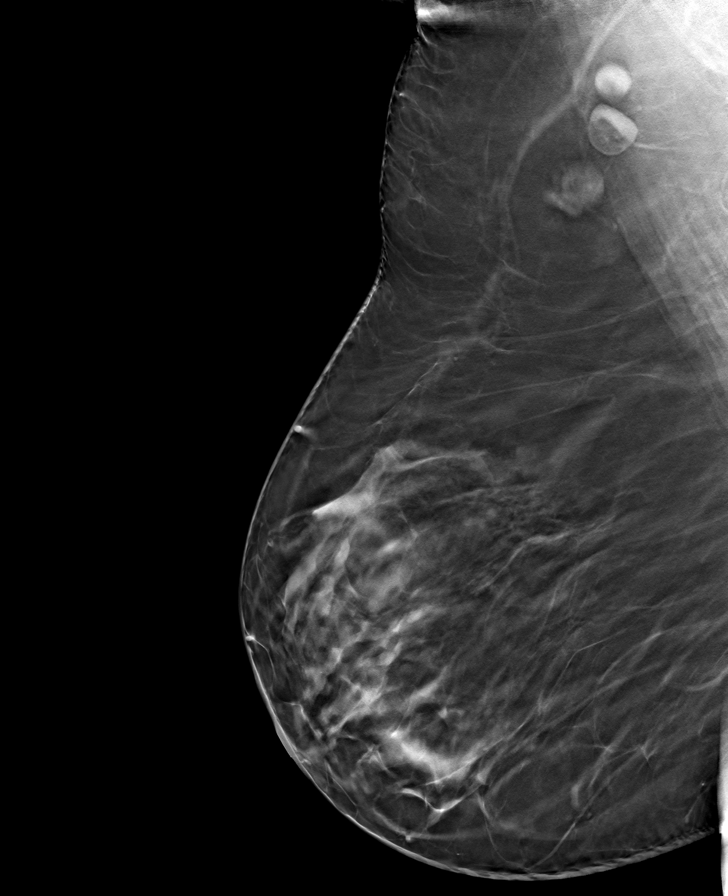

[R CC tomo · tomo slice 38/75.0]
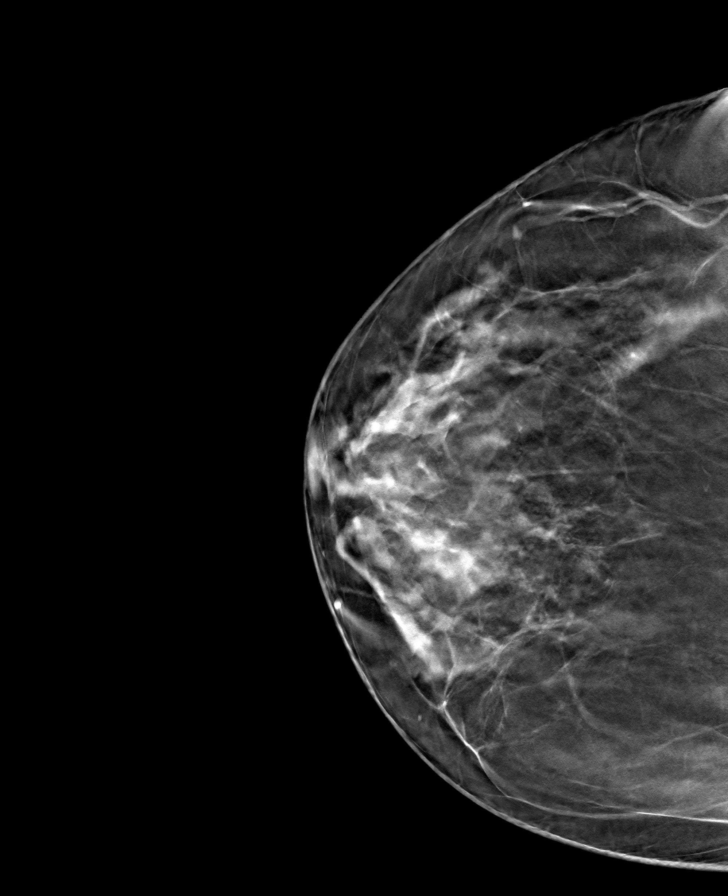

[8 of 24 positions shown; findings below may reference images not displayed]

ACR Breast Density Category c: The breast tissue is heterogeneously
dense, which may obscure small masses.
FINDINGS: In the left breast, a possible asymmetry warrants further
evaluation. This possible asymmetry is seen within the outer LEFT
breast, at middle depth, tomosynthesis CC slice 47 and MLO slice 55.

In the right breast, no findings suspicious for malignancy. Images
were processed with CAD.
IMPRESSION: Further evaluation is suggested for possible asymmetry in the left
breast.

RECOMMENDATION:
Diagnostic mammogram and possibly ultrasound of the left breast.
(Code:[id number]-[id number])

The patient will be contacted regarding the findings, and additional
imaging will be scheduled.

BI-RADS CATEGORY  0: Incomplete. Need additional imaging evaluation
and/or prior mammograms for comparison.

## 2020-10-15 IMAGING — US ULTRASOUND LEFT BREAST LIMITED
1 series · 2 of 2 positions shown · non-contrast
Comparison: Previous exam(s).

CLINICAL DATA: Patient recalled from screening for left breast
asymmetry.

EXAM:
DIGITAL DIAGNOSTIC LEFT MAMMOGRAM WITH CAD AND TOMO
ULTRASOUND LEFT BREAST

[Series 1: ultrasound left breast limited · 0.08mm/px · 2 of 2 slices shown]
[im 1/2]
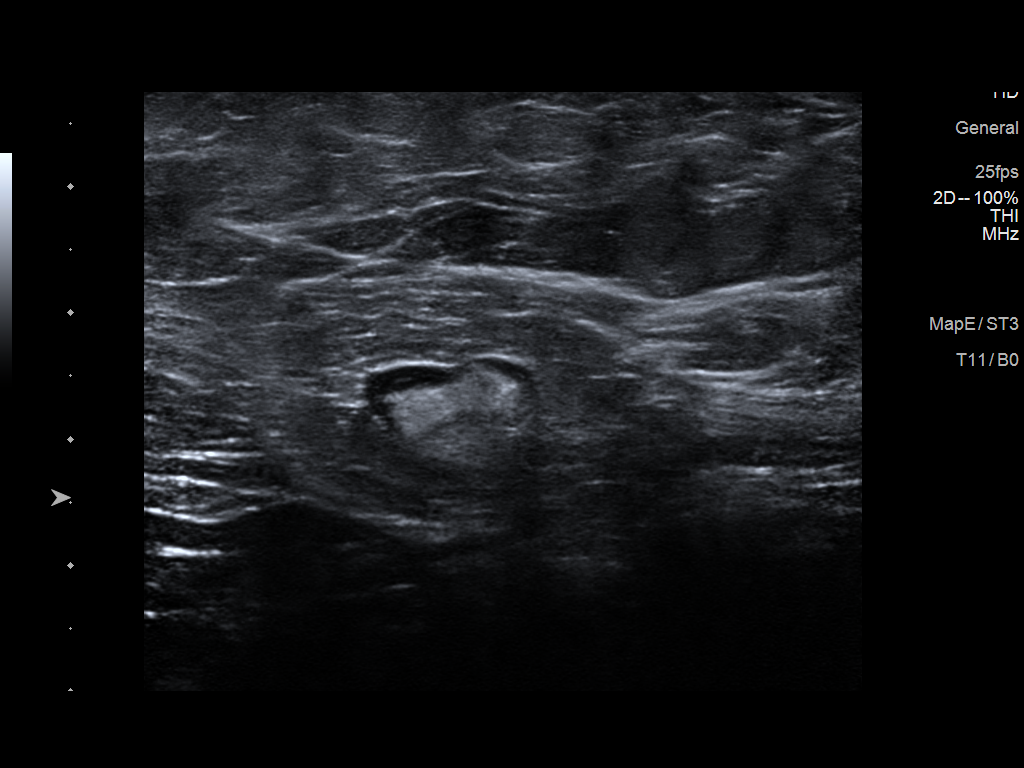
[im 2/2]
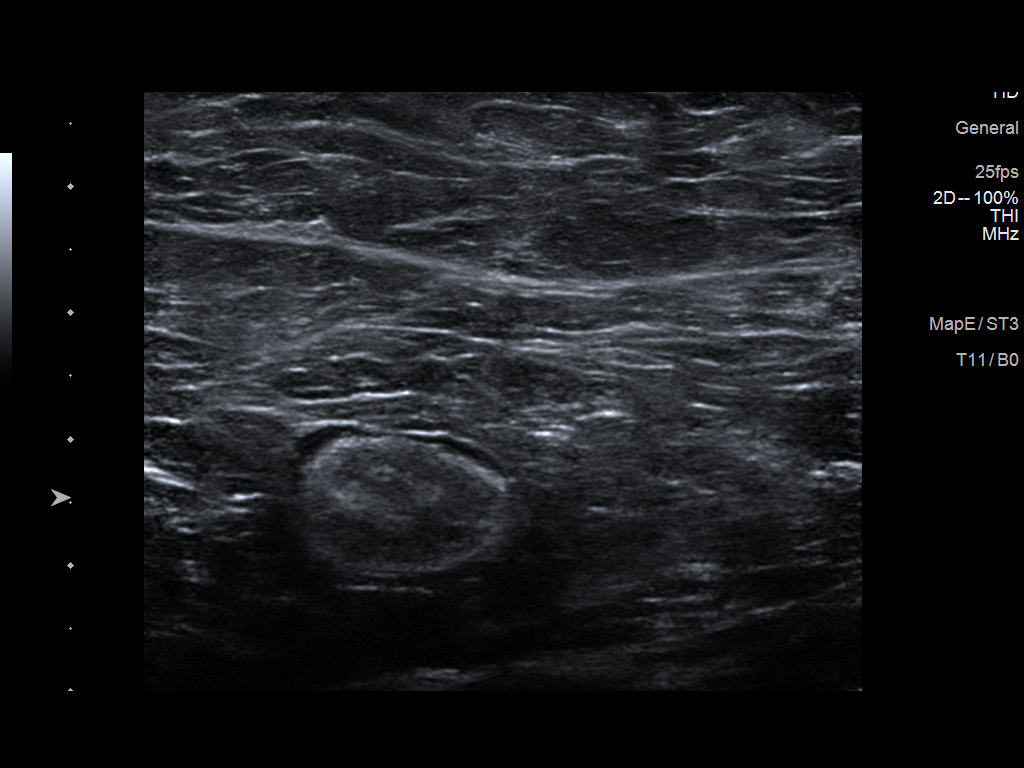

[2 of 2 positions shown; findings below may reference images not displayed]

ACR Breast Density Category b: There are scattered areas of
fibroglandular density.
FINDINGS: Persistent asymmetry with associated distortion within the upper
slightly outer left breast middle depth, further evaluated with CC
and MLO spot tomosynthesis images.

Mammographic images were processed with CAD.

Targeted ultrasound is performed, showing no left axillary
adenopathy. No definite discrete correlate for asymmetry and
distortion within the left breast.
IMPRESSION: Indeterminate focal asymmetry with associated distortion left
breast.

RECOMMENDATION:
Stereotactic guided core needle biopsy left breast asymmetry and
distortion.

I have discussed the findings and recommendations with the patient.
Results were also provided in writing at the conclusion of the
visit. If applicable, a reminder letter will be sent to the patient
regarding the next appointment.

BI-RADS CATEGORY  4: Suspicious.

## 2021-02-04 ENCOUNTER — Ambulatory Visit: Payer: Self-pay

## 2021-02-11 ENCOUNTER — Other Ambulatory Visit: Payer: Self-pay

## 2021-02-11 ENCOUNTER — Ambulatory Visit (INDEPENDENT_AMBULATORY_CARE_PROVIDER_SITE_OTHER): Payer: Self-pay

## 2021-02-11 DIAGNOSIS — Z23 Encounter for immunization: Secondary | ICD-10-CM

## 2021-02-11 NOTE — Progress Notes (Signed)
   Covid-19 Vaccination Clinic  Name:  Conny Situ    MRN: [id number] DOB: August 06, 1966  02/11/2021  Ms. Wyse was observed post Covid-19 immunization for 15 minutes without incident. She was provided with Vaccine Information Sheet and instruction to access the V-Safe system.   Ms. Correll was instructed to call 911 with any severe reactions post vaccine: Marland Kitchen Difficulty breathing  . Swelling of face and throat  . A fast heartbeat  . A bad rash all over body  . Dizziness and weakness   Immunizations Administered    Name Date Dose VIS Date Route   PFIZER Comrnaty(Gray TOP) Covid-19 Vaccine 02/11/2021 10:29 AM 0.3 mL 12/01/2020 Intramuscular   Manufacturer: [employer], [employer]   Lot: [id number]   NDC: [phone]
